# Patient Record
Sex: Female | Born: 1950 | Race: White | Hispanic: No | Marital: Married | Smoking: Never smoker
Health system: Southern US, Community
[De-identification: ages and names within clinical notes are randomized; demographics above are authoritative.]

## PROBLEM LIST (undated history)

## (undated) ENCOUNTER — Emergency Department (HOSPITAL_COMMUNITY): Admission: EM | Payer: 59 | Source: Home / Self Care

## (undated) DIAGNOSIS — L309 Dermatitis, unspecified: Secondary | ICD-10-CM

## (undated) DIAGNOSIS — C4490 Unspecified malignant neoplasm of skin, unspecified: Secondary | ICD-10-CM

## (undated) DIAGNOSIS — E785 Hyperlipidemia, unspecified: Secondary | ICD-10-CM

## (undated) DIAGNOSIS — J45909 Unspecified asthma, uncomplicated: Secondary | ICD-10-CM

## (undated) HISTORY — PX: ANKLE SURGERY: SHX546

## (undated) HISTORY — PX: APPENDECTOMY: SHX54

## (undated) HISTORY — DX: Dermatitis, unspecified: L30.9

## (undated) HISTORY — PX: KNEE SURGERY: SHX244

## (undated) HISTORY — PX: LAPAROTOMY: SHX154

## (undated) HISTORY — DX: Unspecified malignant neoplasm of skin, unspecified: C44.90

## (undated) HISTORY — DX: Hyperlipidemia, unspecified: E78.5

---

## 1999-05-17 ENCOUNTER — Ambulatory Visit (HOSPITAL_COMMUNITY): Admission: RE | Admit: 1999-05-17 | Discharge: 1999-05-17 | Payer: Self-pay | Admitting: Unknown Physician Specialty

## 1999-05-17 ENCOUNTER — Encounter: Payer: Self-pay | Admitting: Unknown Physician Specialty

## 1999-12-18 ENCOUNTER — Encounter: Admission: RE | Admit: 1999-12-18 | Discharge: 2000-03-14 | Payer: Self-pay | Admitting: Orthopedic Surgery

## 2001-05-15 ENCOUNTER — Encounter: Payer: Self-pay | Admitting: Unknown Physician Specialty

## 2001-05-15 ENCOUNTER — Ambulatory Visit (HOSPITAL_COMMUNITY): Admission: RE | Admit: 2001-05-15 | Discharge: 2001-05-15 | Payer: Self-pay | Admitting: Obstetrics and Gynecology

## 2003-09-29 ENCOUNTER — Ambulatory Visit (HOSPITAL_COMMUNITY): Admission: RE | Admit: 2003-09-29 | Discharge: 2003-09-29 | Payer: Self-pay | Admitting: Unknown Physician Specialty

## 2004-04-26 ENCOUNTER — Ambulatory Visit: Payer: Self-pay | Admitting: Internal Medicine

## 2004-06-28 ENCOUNTER — Ambulatory Visit: Payer: Self-pay | Admitting: Family Medicine

## 2004-08-31 ENCOUNTER — Encounter: Admission: RE | Admit: 2004-08-31 | Discharge: 2004-11-29 | Payer: Self-pay | Admitting: Specialist

## 2004-10-18 ENCOUNTER — Encounter: Admission: RE | Admit: 2004-10-18 | Discharge: 2004-10-18 | Payer: Self-pay | Admitting: Specialist

## 2004-10-31 ENCOUNTER — Encounter: Admission: RE | Admit: 2004-10-31 | Discharge: 2004-10-31 | Payer: Self-pay | Admitting: Orthopedic Surgery

## 2004-12-07 ENCOUNTER — Encounter
Admission: RE | Admit: 2004-12-07 | Discharge: 2005-03-07 | Payer: Self-pay | Admitting: Physical Medicine and Rehabilitation

## 2005-01-31 ENCOUNTER — Ambulatory Visit: Payer: Self-pay | Admitting: Internal Medicine

## 2005-02-05 ENCOUNTER — Ambulatory Visit: Payer: Self-pay | Admitting: Internal Medicine

## 2005-07-11 ENCOUNTER — Encounter: Admission: RE | Admit: 2005-07-11 | Discharge: 2005-07-11 | Payer: Self-pay | Admitting: Podiatrist

## 2005-07-16 ENCOUNTER — Encounter: Admission: RE | Admit: 2005-07-16 | Discharge: 2005-09-12 | Payer: Self-pay | Admitting: Family Medicine

## 2005-10-02 ENCOUNTER — Ambulatory Visit (HOSPITAL_COMMUNITY): Admission: RE | Admit: 2005-10-02 | Discharge: 2005-10-02 | Payer: Self-pay | Admitting: Unknown Physician Specialty

## 2005-11-28 ENCOUNTER — Encounter: Admission: RE | Admit: 2005-11-28 | Discharge: 2006-01-15 | Payer: Self-pay

## 2006-10-07 ENCOUNTER — Ambulatory Visit (HOSPITAL_COMMUNITY): Admission: RE | Admit: 2006-10-07 | Discharge: 2006-10-07 | Payer: Self-pay | Admitting: Unknown Physician Specialty

## 2007-09-09 ENCOUNTER — Ambulatory Visit: Payer: Self-pay | Admitting: Internal Medicine

## 2007-09-09 DIAGNOSIS — Z85828 Personal history of other malignant neoplasm of skin: Secondary | ICD-10-CM

## 2007-09-09 DIAGNOSIS — J9801 Acute bronchospasm: Secondary | ICD-10-CM

## 2007-09-09 DIAGNOSIS — J310 Chronic rhinitis: Secondary | ICD-10-CM

## 2007-09-09 DIAGNOSIS — N809 Endometriosis, unspecified: Secondary | ICD-10-CM | POA: Insufficient documentation

## 2007-09-11 ENCOUNTER — Ambulatory Visit: Payer: Self-pay | Admitting: Internal Medicine

## 2007-09-17 LAB — CONVERTED CEMR LAB
AST: 26 units/L (ref 0–37)
Basophils Relative: 1.2 % — ABNORMAL HIGH (ref 0.0–1.0)
CO2: 31 meq/L (ref 19–32)
Calcium: 9.5 mg/dL (ref 8.4–10.5)
Cholesterol: 263 mg/dL (ref 0–200)
Creatinine, Ser: 0.7 mg/dL (ref 0.4–1.2)
Eosinophils Absolute: 0.2 10*3/uL (ref 0.0–0.6)
GFR calc Af Amer: 112 mL/min
Glucose, Bld: 101 mg/dL — ABNORMAL HIGH (ref 70–99)
HDL: 47.9 mg/dL (ref >39.0)
MCHC: 32.6 g/dL (ref 30.0–36.0)
MCV: 87.6 fL (ref 78.0–100.0)
Neutro Abs: 3.7 10*3/uL (ref 1.4–7.7)
Platelets: 306 10*3/uL (ref 150–400)
Potassium: 3.9 meq/L (ref 3.5–5.1)
RDW: 12.5 % (ref 11.5–14.6)
Sodium: 141 meq/L (ref 135–145)

## 2007-09-29 ENCOUNTER — Encounter: Admission: RE | Admit: 2007-09-29 | Discharge: 2007-11-11 | Payer: Self-pay | Admitting: Sports Medicine

## 2007-10-27 ENCOUNTER — Ambulatory Visit (HOSPITAL_COMMUNITY): Admission: RE | Admit: 2007-10-27 | Discharge: 2007-10-27 | Payer: Self-pay | Admitting: Unknown Physician Specialty

## 2009-04-11 ENCOUNTER — Encounter: Admission: RE | Admit: 2009-04-11 | Discharge: 2009-04-11 | Payer: Self-pay | Admitting: Orthopedic Surgery

## 2009-12-13 ENCOUNTER — Ambulatory Visit (HOSPITAL_COMMUNITY): Admission: RE | Admit: 2009-12-13 | Discharge: 2009-12-13 | Payer: Self-pay | Admitting: Unknown Physician Specialty

## 2011-01-16 ENCOUNTER — Encounter: Payer: Self-pay | Admitting: Cardiology

## 2011-01-16 ENCOUNTER — Ambulatory Visit (INDEPENDENT_AMBULATORY_CARE_PROVIDER_SITE_OTHER): Payer: 59 | Admitting: Cardiology

## 2011-01-16 DIAGNOSIS — E785 Hyperlipidemia, unspecified: Secondary | ICD-10-CM

## 2011-01-16 DIAGNOSIS — R079 Chest pain, unspecified: Secondary | ICD-10-CM

## 2011-01-16 NOTE — Patient Instructions (Signed)
We will schedule you for a nuclear stress.  Take a baby ASA daily.  We will decide about cholesterol therapy depending on your cardiac evaluation.  If you have chest pain unrelieved with rest call 911.

## 2011-01-16 NOTE — Progress Notes (Signed)
Allison Webb Date of Birth: 08-09-1950   History of Present Illness: Allison Webb is seen in the request of Dr. Tenny Craw for evaluation of chest pain. She is a pleasant 60 year old white female who has a history of severe hypercholesterolemia. This past Friday she was taking her normal evening walk when she developed mid substernal chest pain described as a tightness. There was no radiation. She did feel somewhat breathless. She noted that the weather was very hot. She rested and her pain went away within 5 minutes. Yesterday she was walking again when she developed similar chest tightness. She had to sit down and the pain was relieved. She then started walking again and her chest pain recurred but was more severe. She described this as pressing real hard in her chest. Today she has had some minor tightness in her chest. She does stay active and usually walks for 30 minutes a night. She also goes to the gym occasionally. She does have severe hypercholesterolemia but is on no medication at this time.  Current Outpatient Prescriptions on File Prior to Visit  Medication Sig Dispense Refill  . finasteride (PROSCAR) 5 MG tablet Take 2.5 mg by mouth as needed.        . naproxen sodium (ANAPROX) 220 MG tablet Take 220 mg by mouth as needed.          Allergies  Allergen Reactions  . Codeine Nausea Only  . Erythromycin     Past Medical History  Diagnosis Date  . Hyperlipidemia   . Skin cancer   . Endometriosis   . Asthma     Past Surgical History  Procedure Date  . Appendectomy   . Laparotomy   . Knee surgery     right knee  . Ankle surgery     right ankle    History  Smoking status  . Never Smoker   Smokeless tobacco  . Not on file    History  Alcohol Use No    Family History  Problem Relation Age of Onset  . Diabetes Father   . Cancer Father     prostate  . Cancer Mother     breast  . Diabetes Mother   . Aneurysm Mother     celiac  . Thyroid disease Sister   . Sleep  apnea Brother   . Thyroid disease Sister   . Celiac disease Sister     Review of Systems: The review of systems is negative for diabetes or hypertension. She is a nonsmoker. All other systems were reviewed and are negative.  Physical Exam: BP 126/88  Pulse 58  Ht 5\' 7"  (1.702 m)  Wt 148 lb 3.2 oz (67.223 kg)  BMI 23.21 kg/m2 She is a pleasant white female in no acute distress. She is normocephalic, atraumatic. Pupils are equal round and reactive to light and accommodation. Extraocular movements are full. Oropharynx is clear. Neck is supple no JVD, adenopathy, thyromegaly, or bruits. Lungs are clear to station and percussion. Cardiac exam reveals a regular rate and rhythm without gallop, murmur, or click. Abdomen is soft and nontender without masses or bruits. Extremities are without edema. Pulses are 2+ and symmetric. Skin is warm and dry. She is alert and oriented x3. Cranial nerves II through XII are intact. LABORATORY DATA: ECG today is normal. Blood work from March of 2012 showed a normal chemistry panel except for an ALT of 68. Total cholesterol 280, trouble stress 227, LDL 189, HDL 48.  Assessment / Plan:

## 2011-01-17 ENCOUNTER — Encounter: Payer: Self-pay | Admitting: Cardiology

## 2011-01-18 ENCOUNTER — Encounter: Payer: Self-pay | Admitting: Cardiology

## 2011-01-18 ENCOUNTER — Encounter: Payer: Self-pay | Admitting: *Deleted

## 2011-01-18 ENCOUNTER — Ambulatory Visit (HOSPITAL_COMMUNITY): Payer: 59 | Attending: Cardiology | Admitting: Radiology

## 2011-01-18 DIAGNOSIS — R079 Chest pain, unspecified: Secondary | ICD-10-CM | POA: Insufficient documentation

## 2011-01-18 DIAGNOSIS — R0789 Other chest pain: Secondary | ICD-10-CM

## 2011-01-18 DIAGNOSIS — E785 Hyperlipidemia, unspecified: Secondary | ICD-10-CM | POA: Insufficient documentation

## 2011-01-18 MED ORDER — TECHNETIUM TC 99M TETROFOSMIN IV KIT
11.0000 | PACK | Freq: Once | INTRAVENOUS | Status: AC | PRN
  Administered 2011-01-18: 11 via INTRAVENOUS

## 2011-01-18 MED ORDER — TECHNETIUM TC 99M TETROFOSMIN IV KIT
33.0000 | PACK | Freq: Once | INTRAVENOUS | Status: AC | PRN
  Administered 2011-01-18: 33 via INTRAVENOUS

## 2011-01-18 NOTE — Progress Notes (Signed)
[facility] SITE 3 NUCLEAR MED [address] [place] [address] [phone]  Cardiology Nuclear Med Study  [name] is a 60 y.o. female [id number] [birth year]   Nuclear Med Background Indication for Stress Test:  Evaluation for Ischemia History:  Hx. of acute bronchospasm; No prior known history of CAD Cardiac Risk Factors: Family History - CAD and Lipids  Symptoms:  Chest Tightness with and without exertion (last date of chest discomfort  at present 1-2/10), DOE, Fatigue and Fatigue with Exertion   Nuclear Pre-Procedure Caffeine/Decaff Intake:  None NPO After: 11:30pm   Lungs:  Clear; O2 Sat 100% RA IV 0.9% NS with Angio Cath:  20g  IV Site: R Antecubital  IV Started by:  Irean Hong, RN  Chest Size (in):  36 Cup Size: D  Height: 5\' 7"  (1.702 m)  Weight:  147 lb (66.679 kg)  BMI:  Body mass index is 23.02 kg/(m^2). Tech Comments:  N/A    Nuclear Med Study 1 or 2 day study: 1 day  Stress Test Type:  Stress  Reading MD: [name], MD  Order Authorizing Provider: Peter Swaziland, MD  Resting Radionuclide: Technetium 23m Tetrofosmin  Resting Radionuclide Dose: 11.0 mCi   Stress Radionuclide:  Technetium 36m Tetrofosmin  Stress Radionuclide Dose: 33.0 mCi           Stress Protocol Rest HR: 72 Stress HR: 139  Rest BP: 104/81 Stress BP: 125/68  Exercise Time (min): 8:15 METS: 10.2   Predicted Max HR: 162 bpm % Max HR: 85.8 bpm Rate Pressure Product: 02725   Dose of Adenosine (mg):  n/a Dose of Lexiscan: n/a mg  Dose of Atropine (mg): n/a Dose of Dobutamine: n/a mcg/kg/min (at max HR)  Stress Test Technologist: Irean Hong, RN  Nuclear Technologist:  Domenic Polite, CNMT     Rest Procedure:  Myocardial perfusion imaging was performed at rest 45 minutes following the intravenous administration of Technetium 5m Tetrofosmin. Rest ECG: NSR with early replorization  Stress Procedure:  The patient exercised for 8 minutes and 15 seconds,  RPE=15.  The patient stopped due to DOE and baseline chest tightness increased to 4-5/10 at peak exercise.  There were nonspecific ST-T wave changes. Technetium 47m Tetrofosmin was injected at peak exercise and myocardial perfusion imaging was performed after a brief delay. Stress ECG: No significant change from baseline ECG  QPS Raw Data Images:  Normal; no motion artifact; normal heart/lung ratio. Stress Images:  No significant abnormalities. Rest Images:  Same as stress Subtraction (SDS):  No evidence of ischemia. Transient Ischemic Dilatation (Normal <1.22):  1.0 Lung/Heart Ratio (Normal <0.45):  0.26  Quantitative Gated Spect Images QGS EDV:  70 ml QGS ESV:  19 ml QGS cine images:  Normal Wall Motion QGS EF: 72%  Impression Exercise Capacity:  Good exercise capacity. BP Response:  Normal blood pressure response. Clinical Symptoms:  Chest tightness ECG Impression:  No significant ST segment change suggestive of ischemia. Comparison with Prior Nuclear Study: No previous nuclear study performed  Overall Impression:  Patient had chest tightness. But there was no EKG change. The nuclear images are normal.   [name]

## 2011-01-18 NOTE — Assessment & Plan Note (Signed)
She has had 2 episodes of exertional chest pain are concerning for angina. Her only cardiac risk factor is a history of significant hyperlipidemia. I recommended that she take an aspirin daily. We will schedule her this week for a stress Myoview study. If abnormal she will need cardiac catheterization. If she does have coronary disease we will need to take an aggressive approach to her hyperlipidemia.

## 2011-01-18 NOTE — Assessment & Plan Note (Signed)
She has significant combined hyperlipidemia. She reports eating a fairly healthy diet. She is opposed to taking medication but we will need to consider this especially if she turns out to have evidence of vascular disease. We will discuss further after her stress test.

## 2011-01-19 NOTE — Progress Notes (Signed)
Nuclear report routed to Dr. Swaziland. Jayanth Szczesniak, Farris Has

## 2011-01-19 NOTE — Progress Notes (Signed)
Fortunately stress nuclear study is normal. EF is normal. I would like to follow up again in one month to see how she is doing. If her symptoms worsen let me know. Let's repeat a lipid panel with her visit. Theron Arista Sw[place]iland

## 2011-01-22 ENCOUNTER — Encounter (HOSPITAL_COMMUNITY): Payer: 59 | Admitting: Radiology

## 2011-01-22 NOTE — Progress Notes (Signed)
Notified of stress test results. Wants to modify diet for about 3 months and exercise before getting lipids checked again. Is feeling better and wants to wait before making another appointment. Wants to get labs done at Dr. Charlott Rakes office. Advised that needs to make sure we get a copy. Will call back if has any further symptoms of chest pain.,

## 2011-01-30 ENCOUNTER — Other Ambulatory Visit (HOSPITAL_COMMUNITY): Payer: Self-pay | Admitting: *Deleted

## 2011-01-30 DIAGNOSIS — Z1231 Encounter for screening mammogram for malignant neoplasm of breast: Secondary | ICD-10-CM

## 2011-02-08 ENCOUNTER — Ambulatory Visit (HOSPITAL_COMMUNITY)
Admission: RE | Admit: 2011-02-08 | Discharge: 2011-02-08 | Disposition: A | Discharge status: Home / Self Care | Payer: 59 | Source: Ambulatory Visit | Attending: Diagnostic Radiology | Admitting: Diagnostic Radiology

## 2011-02-08 DIAGNOSIS — Z1231 Encounter for screening mammogram for malignant neoplasm of breast: Secondary | ICD-10-CM | POA: Insufficient documentation

## 2011-03-01 ENCOUNTER — Ambulatory Visit: Payer: 59 | Admitting: Cardiology

## 2013-01-07 ENCOUNTER — Encounter (HOSPITAL_COMMUNITY): Payer: Self-pay | Admitting: Emergency Medicine

## 2013-01-07 ENCOUNTER — Emergency Department (HOSPITAL_COMMUNITY)
Admission: EM | Admit: 2013-01-07 | Discharge: 2013-01-07 | Disposition: A | Discharge status: Other Acute Inpatient Hospital | Payer: 59 | Source: Home / Self Care | Attending: Family Medicine | Admitting: Family Medicine

## 2013-01-07 ENCOUNTER — Encounter (HOSPITAL_COMMUNITY): Payer: Self-pay

## 2013-01-07 ENCOUNTER — Emergency Department (HOSPITAL_COMMUNITY)
Admission: EM | Admit: 2013-01-07 | Discharge: 2013-01-07 | Disposition: A | Discharge status: Home / Self Care | Payer: 59 | Attending: Emergency Medicine | Admitting: Emergency Medicine

## 2013-01-07 DIAGNOSIS — Z862 Personal history of diseases of the blood and blood-forming organs and certain disorders involving the immune mechanism: Secondary | ICD-10-CM | POA: Insufficient documentation

## 2013-01-07 DIAGNOSIS — T148XXA Other injury of unspecified body region, initial encounter: Secondary | ICD-10-CM

## 2013-01-07 DIAGNOSIS — Z8639 Personal history of other endocrine, nutritional and metabolic disease: Secondary | ICD-10-CM | POA: Insufficient documentation

## 2013-01-07 DIAGNOSIS — R0789 Other chest pain: Secondary | ICD-10-CM

## 2013-01-07 DIAGNOSIS — R071 Chest pain on breathing: Secondary | ICD-10-CM | POA: Insufficient documentation

## 2013-01-07 DIAGNOSIS — Z8709 Personal history of other diseases of the respiratory system: Secondary | ICD-10-CM | POA: Insufficient documentation

## 2013-01-07 DIAGNOSIS — Z8742 Personal history of other diseases of the female genital tract: Secondary | ICD-10-CM | POA: Insufficient documentation

## 2013-01-07 DIAGNOSIS — R079 Chest pain, unspecified: Secondary | ICD-10-CM

## 2013-01-07 DIAGNOSIS — Z85828 Personal history of other malignant neoplasm of skin: Secondary | ICD-10-CM | POA: Insufficient documentation

## 2013-01-07 DIAGNOSIS — Z7982 Long term (current) use of aspirin: Secondary | ICD-10-CM | POA: Insufficient documentation

## 2013-01-07 DIAGNOSIS — Z79899 Other long term (current) drug therapy: Secondary | ICD-10-CM | POA: Insufficient documentation

## 2013-01-07 DIAGNOSIS — J45909 Unspecified asthma, uncomplicated: Secondary | ICD-10-CM | POA: Insufficient documentation

## 2013-01-07 LAB — POCT URINALYSIS DIP (DEVICE)
Bilirubin Urine: NEGATIVE
Glucose, UA: NEGATIVE mg/dL
Ketones, ur: NEGATIVE mg/dL
Leukocytes, UA: NEGATIVE
Urobilinogen, UA: 0.2 mg/dL (ref 0.0–1.0)

## 2013-01-07 LAB — POCT I-STAT, CHEM 8
BUN: 17 mg/dL (ref 6–23)
Calcium, Ion: 1.25 mmol/L (ref 1.13–1.30)
Creatinine, Ser: 0.9 mg/dL (ref 0.50–1.10)
Glucose, Bld: 89 mg/dL (ref 70–99)
HCT: 39 % (ref 36.0–46.0)
Potassium: 4.8 mEq/L (ref 3.5–5.1)
Sodium: 142 mEq/L (ref 135–145)
TCO2: 27 mmol/L (ref 0–100)

## 2013-01-07 LAB — POCT I-STAT TROPONIN I: Troponin i, poc: 0 ng/mL (ref 0.00–0.08)

## 2013-01-07 NOTE — ED Notes (Signed)
Pt c/o left sided rib pain around to back; pt sent from [facility] for blood test; pt denies SOB and sts started this am

## 2013-01-07 NOTE — ED Notes (Addendum)
States that ~10 am today, she experienced pain in mid left chest w extending to back. Denies sweating, SOB, but had some brief nausea. In midst of wedding for nephew . Party last night, another tonight, a lot of out of town guests, has picked her grand child up several times (60 yr old) had not seen her son for past year and he is in town. Wanting to be checked out to be sure she is not having a heart attack. Reportedly had a "clean: stress test 2 years ago History of high cholesterol, but has not been taking the vitamins she was directed to take for past year or so

## 2013-01-07 NOTE — ED Notes (Signed)
Family at bedside. 

## 2013-01-07 NOTE — ED Notes (Signed)
bloodwork collected after two attempts. Pt anxious statesshe needs to leave because she states she has family in town. Pt states, "I just want the enzyme test, not the xray. I have family in town visiting and I can't stay."

## 2013-01-07 NOTE — ED Notes (Signed)
Pt had EKG at [facility], shown to EDP

## 2013-01-07 NOTE — ED Provider Notes (Signed)
History    CSN: [id number] Arrival date & time 01/07/13  1135  First MD Initiated Contact with Patient 01/07/13 1202     Chief Complaint  Patient presents with  . Chest Pain   (Consider location/radiation/quality/duration/timing/severity/associated sxs/prior Treatment) HPI Comments: 62 year old female with history of hyperlipidemia.  Here complaining of left side chest pain with radiation toward her back intermittently since 10 AM today. Pain associated with mild nausea. Denies diaphoresis, shortest of breath vomiting or abdominal pain. Patient states position changes trigger her pain. Denies cough or congestion. No pain with taking deep breath. No fever or chills. Denies headache or dizziness. States that she has been very active with home guests and her grandchildren this week and has been picking them up (one is 60 years old) from the floor repetedly before her pain started. Patient states she had episodes of chest pressure 2 years ago and had a "clear cardiac stress test".  Past Medical History  Diagnosis Date  . Hyperlipidemia   . Skin cancer   . Endometriosis   . Asthma    Past Surgical History  Procedure Laterality Date  . Appendectomy    . Laparotomy    . Knee surgery      right knee  . Ankle surgery      right ankle   Family History  Problem Relation Age of Onset  . Diabetes Father   . Cancer Father     prostate  . Cancer Mother     breast  . Diabetes Mother   . Aneurysm Mother     celiac  . Thyroid disease Sister   . Sleep apnea Brother   . Thyroid disease Sister   . Celiac disease Sister    History  Substance Use Topics  . Smoking status: Never Smoker   . Smokeless tobacco: Not on file  . Alcohol Use: No   OB History   Grav Para Term Preterm Abortions TAB SAB Ect Mult Living                 Review of Systems  Constitutional: Negative for fever, chills, diaphoresis, appetite change and fatigue.  Respiratory: Negative for chest tightness and  shortness of breath.   Cardiovascular: Positive for chest pain. Negative for palpitations and leg swelling.  Gastrointestinal: Positive for nausea. Negative for vomiting, abdominal pain and diarrhea.  Skin: Negative for rash.  All other systems reviewed and are negative.    Allergies  Codeine and Erythromycin  Home Medications   Current Outpatient Rx  Name  Route  Sig  Dispense  Refill  . finasteride (PROSCAR) 5 MG tablet   Oral   Take 2.5 mg by mouth as needed.           . L-TRYPTOPHAN PO   Oral   Take by mouth as needed.           . naproxen sodium (ANAPROX) 220 MG tablet   Oral   Take 220 mg by mouth as needed.            There were no vitals taken for this visit. Physical Exam  Nursing note and vitals reviewed. Constitutional: She is oriented to person, place, and time. She appears well-developed and well-nourished. No distress.  HENT:  Head: Normocephalic and atraumatic.  Eyes: EOM are normal. Pupils are equal, round, and reactive to light.  Neck: Neck supple. No JVD present. No thyromegaly present.  Cardiovascular: Normal rate, regular rhythm, normal heart sounds and intact distal pulses.  Exam reveals no gallop and no friction rub.   No murmur heard. Pulmonary/Chest: Effort normal and breath sounds normal. No respiratory distress. She has no wheezes. She has no rales. She exhibits no tenderness.  Non reproducible pain with compression of left costochondral areas.   Lymphadenopathy:    She has no cervical adenopathy.  Neurological: She is alert and oriented to person, place, and time.  Skin: No rash noted. She is not diaphoretic.    ED Course  Procedures (including critical care time) Labs Reviewed  POCT URINALYSIS DIP (DEVICE) - Abnormal; Notable for the following:    Hgb urine dipstick TRACE (*)    All other components within normal limits   No results found. 1. Chest pain     EKG: Normal sinus rhythm with ventricular rate at 67 beats per minutes.  No acute ischemic changes.  MDM  62 year old female with history of hyperlipidemia  Here complaining of left side chest pain with radiation toward her back intermittently since 10 AM today. Pain associated with mild nausea. Denies diaphoresis, shortest of breath vomiting or abdominal pain. Patient states position changes triggered her pain. On exam: Vital signs are normal and stable. Normal lung, heart and abdominal exam. Decided to transfer to the emergency department for further evaluation and management.    Sharin Grave, MD 01/07/13 [phone]

## 2013-01-07 NOTE — ED Notes (Signed)
Report to nurse first to advise of pending transfer via shuttle. NAD at time of tenasfer

## 2013-01-07 NOTE — ED Provider Notes (Addendum)
History    CSN: [id number] Arrival date & time 01/07/13  1232  First MD Initiated Contact with Patient 01/07/13 1246     Chief Complaint  Patient presents with  . Chest Pain   (Consider location/radiation/quality/duration/timing/severity/associated sxs/prior Treatment) Patient is a 62 y.o. female presenting with chest pain. The history is provided by the patient.  Chest Pain Pain location:  L chest (started in the left thoracic region and radiated into the chest) Pain quality: sharp and stabbing   Pain radiates to:  Does not radiate Associated symptoms: no abdominal pain, no cough, no fatigue, no nausea, no palpitations, no shortness of breath and not vomiting    Past Medical History  Diagnosis Date  . Hyperlipidemia   . Skin cancer   . Endometriosis   . Asthma    Past Surgical History  Procedure Laterality Date  . Appendectomy    . Laparotomy    . Knee surgery      right knee  . Ankle surgery      right ankle   Family History  Problem Relation Age of Onset  . Diabetes Father   . Cancer Father     prostate  . Cancer Mother     breast  . Diabetes Mother   . Aneurysm Mother     celiac  . Thyroid disease Sister   . Sleep apnea Brother   . Thyroid disease Sister   . Celiac disease Sister    History  Substance Use Topics  . Smoking status: Never Smoker   . Smokeless tobacco: Not on file  . Alcohol Use: No   OB History   Grav Para Term Preterm Abortions TAB SAB Ect Mult Living                 Review of Systems  Constitutional: Negative for fatigue.  Respiratory: Negative for cough and shortness of breath.   Cardiovascular: Positive for chest pain. Negative for palpitations and leg swelling.  Gastrointestinal: Negative for nausea, vomiting and abdominal pain.  All other systems reviewed and are negative.    Allergies  Codeine and Erythromycin  Home Medications   Current Outpatient Rx  Name  Route  Sig  Dispense  Refill  . Acetaminophen (TYLENOL  PO)   Oral   Take 1 tablet by mouth once.         Marland Kitchen aspirin EC 325 MG tablet   Oral   Take 325 mg by mouth once.         . Ibuprofen (IBU PO)   Oral   Take 1 tablet by mouth once.         . Naproxen Sodium (ALEVE PO)   Oral   Take 2 capsules by mouth once.          BP 118/71  Pulse 67  Temp(Src) 98.3 F (36.8 C) (Oral)  Resp 18  SpO2 97% Physical Exam  Nursing note and vitals reviewed. Constitutional: She is oriented to person, place, and time. She appears well-developed and well-nourished. No distress.  HENT:  Head: Normocephalic and atraumatic.  Mouth/Throat: Oropharynx is clear and moist.  Eyes: Conjunctivae and EOM are normal. Pupils are equal, round, and reactive to light.  Neck: Normal range of motion. Neck supple.  Cardiovascular: Normal rate, regular rhythm and intact distal pulses.   No murmur heard. Pulmonary/Chest: Effort normal and breath sounds normal. No respiratory distress. She has no wheezes. She has no rales.  Abdominal: Soft. She exhibits no distension. There  is no tenderness. There is no rebound and no guarding.  Musculoskeletal: Normal range of motion. She exhibits no edema and no tenderness.       Arms: Mild tenderness to palpation in the left side of thoracic spine  Neurological: She is alert and oriented to person, place, and time.  Skin: Skin is warm and dry. No rash noted. No erythema.  Psychiatric: She has a normal mood and affect. Her behavior is normal.    ED Course  Procedures (including critical care time) Labs Reviewed  POCT I-STAT, CHEM 8  POCT I-STAT TROPONIN I   No results found. 1. Muscle strain   2. Chest wall pain     MDM   Pt with atypical story for CP that started in the back when she picked up a watermelon and radiated around to her left chest without associated sx.  TIMI  0 and no risk factors.  No sx suggestive of PE.  EKG wnl from urgent care.  CXR, i-stat and troponin pending.  1:34 PM Labs wnl.  Pt  refusing CXR and states will f/u with PCP.  Pt understands the risk of no CXR but will f/u with PCP.  Gwyneth Sprout, MD 01/07/13 1335  Gwyneth Sprout, MD 01/07/13 1336

## 2013-04-08 ENCOUNTER — Other Ambulatory Visit (HOSPITAL_COMMUNITY): Payer: Self-pay | Admitting: Obstetrics and Gynecology

## 2013-04-08 DIAGNOSIS — Z1231 Encounter for screening mammogram for malignant neoplasm of breast: Secondary | ICD-10-CM

## 2013-04-09 ENCOUNTER — Ambulatory Visit (HOSPITAL_COMMUNITY)
Admission: RE | Admit: 2013-04-09 | Discharge: 2013-04-09 | Disposition: A | Discharge status: Home / Self Care | Payer: Managed Care, Other (non HMO) | Source: Ambulatory Visit | Attending: Obstetrics and Gynecology | Admitting: Obstetrics and Gynecology

## 2013-04-09 DIAGNOSIS — Z1231 Encounter for screening mammogram for malignant neoplasm of breast: Secondary | ICD-10-CM | POA: Insufficient documentation

## 2014-05-17 ENCOUNTER — Encounter: Payer: Self-pay | Admitting: Internal Medicine

## 2014-05-17 ENCOUNTER — Encounter (INDEPENDENT_AMBULATORY_CARE_PROVIDER_SITE_OTHER): Payer: Self-pay

## 2014-05-17 ENCOUNTER — Ambulatory Visit (INDEPENDENT_AMBULATORY_CARE_PROVIDER_SITE_OTHER)
Admission: RE | Admit: 2014-05-17 | Discharge: 2014-05-17 | Disposition: A | Discharge status: Home / Self Care | Payer: Managed Care, Other (non HMO) | Source: Ambulatory Visit | Attending: Internal Medicine | Admitting: Internal Medicine

## 2014-05-17 ENCOUNTER — Telehealth: Payer: Self-pay | Admitting: Internal Medicine

## 2014-05-17 ENCOUNTER — Ambulatory Visit (INDEPENDENT_AMBULATORY_CARE_PROVIDER_SITE_OTHER): Payer: Managed Care, Other (non HMO) | Admitting: Internal Medicine

## 2014-05-17 VITALS — BP 120/70 | HR 87 | Temp 98.1°F | Ht 66.5 in | Wt 152.0 lb

## 2014-05-17 DIAGNOSIS — R059 Cough, unspecified: Secondary | ICD-10-CM

## 2014-05-17 DIAGNOSIS — R053 Chronic cough: Secondary | ICD-10-CM | POA: Insufficient documentation

## 2014-05-17 DIAGNOSIS — R05 Cough: Secondary | ICD-10-CM

## 2014-05-17 MED ORDER — PANTOPRAZOLE SODIUM 40 MG PO TBEC: 40.0000 mg | DELAYED_RELEASE_TABLET | Freq: Every day | ORAL | Status: DC

## 2014-05-17 MED ORDER — FAMOTIDINE 20 MG PO TABS: ORAL_TABLET | ORAL | Status: DC

## 2014-05-17 MED ORDER — PREDNISONE 10 MG PO TABS: ORAL_TABLET | ORAL | Status: DC

## 2014-05-17 MED ORDER — TRAMADOL HCL 50 MG PO TABS: 50.0000 mg | ORAL_TABLET | ORAL | Status: DC | PRN

## 2014-05-17 NOTE — Progress Notes (Signed)
Subjective:    Patient ID: Allison Webb, female    DOB: [birth year],   MRN: [id number]  HPI  89 yowf never smoker with cough since mid 2014 referred by Dr Melinda Crutch for refractory cough to pulmonary clinic 05/18/2014    05/17/2014 1st [place] [facility] office visit/ [facility]   Chief Complaint  Patient presents with  . Pulmonary Consult    Self referral. Pt c/o cough for the past 1.5 yrs. Cough is esp worse at night and is non prod. It can be triggered by taking a deep breath.     Coughing daily, dry , never with exertion but frequently with deep  Breath  ? Acute onset with uri but persistent pattern developed after URI resolved  not resp to date gerd rx in low doses or otc's or nasal steroids.    Kouffman Reflux v Neurogenic Cough Differentiator Reflux Comments  Do you awaken from a sound sleep coughing violently?                            With trouble breathing? no   Do you have choking episodes when you cannot  Get enough air, gasping for air ?              no   Do you usually cough when you lie down into  The bed, or when you just lie down to rest ?                          Yes   Do you usually cough after meals or eating?         Maybe, nuts   Do you cough when (or after) you bend over?    no   GERD SCORE     Kouffman Reflux v Neurogenic Cough Differentiator Neurogenic   Do you more-or-less cough all day long? No spurts   Does change of temperature make you cough? no   Does laughing or chuckling cause you to cough? no   Do fumes (perfume, automobile fumes, burned  Toast, etc.,) cause you to cough ?      no   Does speaking, singing, or talking on the phone cause you to cough   ?               Yes    Neurogenic/Airway score        ent eval one year prior to OV  > neg  No obvious other patterns in day to day or daytime variabilty or assoc sob or cp or chest tightness, subjective wheeze overt sinus or hb symptoms. No unusual exp hx or h/o childhood pna/ asthma or knowledge  of premature birth.  Sleeping ok without nocturnal  or early am exacerbation  of respiratory  c/o's or need for noct saba. Also denies any obvious fluctuation of symptoms with weather or environmental changes or other aggravating or alleviating factors except as outlined above   Current Medications, Allergies, Complete Past Medical History, Past Surgical History, Family History, and Social History were reviewed in [employer] record.          Review of Systems  Constitutional: Negative for fever, chills and unexpected weight change.  HENT: Positive for sore throat. Negative for congestion, dental problem, ear pain, nosebleeds, postnasal drip, rhinorrhea, sinus pressure, sneezing, trouble swallowing and voice change.   Eyes: Negative for visual disturbance.  Respiratory: Positive  for cough. Negative for choking and shortness of breath.   Cardiovascular: Negative for chest pain and leg swelling.  Gastrointestinal: Negative for vomiting, abdominal pain and diarrhea.  Genitourinary: Negative for difficulty urinating.       Acid heartburn  Musculoskeletal: Negative for arthralgias.  Skin: Negative for rash.  Neurological: Negative for tremors, syncope and headaches.  Hematological: Does not bruise/bleed easily.       Objective:   Physical Exam  amb pleasant wf nad  HEENT: nl dentition, turbinates, and orophanx. Nl external ear canals without cough reflex   NECK :  without JVD/Nodes/TM/ nl carotid upstrokes bilaterally   LUNGS: no acc muscle use, clear to A and P bilaterally without cough on insp or exp maneuvers   CV:  RRR  no s3 or murmur or increase in P2, no edema   ABD:  soft and nontender with nl excursion in the supine position. No bruits or organomegaly, bowel sounds nl  MS:  warm without deformities, calf tenderness, cyanosis or clubbing  SKIN: warm and dry without lesions    NEURO:  alert, approp, no deficits   CXR  05/17/2014 :    No  active cardiopulmonary disease        Assessment & Plan:

## 2014-05-17 NOTE — Patient Instructions (Signed)
The key to effective treatment for your cough is eliminating the non-stop cycle of cough you're stuck in long enough to let your airway heal completely and then see if there is anything still making you cough once you stop the cough suppression, but this should take no more than 5 days to figure out  First take delsym two tsp every 12 hours and supplement if needed with  tramadol 50 mg up to 2 every 4 hours to suppress the urge to cough at all or even clear your throat. Swallowing water or using ice chips/non mint and menthol containing candies (such as lifesavers or sugarless jolly ranchers) are also effective.  You should rest your voice and avoid activities that you know make you cough.  Once you have eliminated the cough for 3 straight days try reducing the tramadol first,  then the delsym as tolerated.    Prednisone 10 mg take  4 each am x 2 days,   2 each am x 2 days,  1 each am x 2 days and stop (this is to eliminate allergies and inflammation from coughing)  Protonix (pantoprazole) Take 30-60 min before first meal of the day and Pepcid 20 mg one bedtime plus chlorpheniramine 4 mg x 2 at bedtime (both available over the counter)  until cough is completely gone for at least a week without the need for cough suppression  GERD (REFLUX)  is an extremely common cause of respiratory symptoms, many times with no significant heartburn at all.    It can be treated with medication, but also with lifestyle changes including avoidance of late meals, excessive alcohol, smoking cessation, and avoid fatty foods, chocolate, peppermint, colas, red wine, and acidic juices such as orange juice.  NO MINT OR MENTHOL PRODUCTS SO NO COUGH DROPS  USE HARD CANDY INSTEAD (jolley ranchers or Stover's or Lifesavers (all available in sugarless versions) NO OIL BASED VITAMINS - use powdered substitutes.   Please remember to go to the  x-ray department downstairs for your tests - we will call you with the results when  they are available.     Please schedule a follow up office visit in 2 weeks, sooner if needed

## 2014-05-17 NOTE — Telephone Encounter (Signed)
Pt states that she has an intolerance to Tramadol and is concerned to take this.  Would like other rec's for another medication. [facility]  Dr Melvyn Novas please advise. Thanks.

## 2014-05-18 ENCOUNTER — Telehealth: Payer: Self-pay | Admitting: Internal Medicine

## 2014-05-18 NOTE — Telephone Encounter (Signed)
Spoke with the pt a She states that she remembered after her visit 05/17/14 that she can not take tramadol  "It makes me deathly ill" I have added this to list of allergies Pt asking for something else for the cough  Please advise, thanks!

## 2014-05-18 NOTE — Telephone Encounter (Signed)
Try vicodin 5-325 #40 take 1-2 every 4 hours prn cough

## 2014-05-18 NOTE — Progress Notes (Signed)
Quick Note:  lmtcb ______ 

## 2014-05-18 NOTE — Progress Notes (Signed)
Quick Note:  Spoke with pt and notified of results per Dr. Wert. Pt verbalized understanding and denied any questions.  ______ 

## 2014-05-18 NOTE — Assessment & Plan Note (Addendum)
The most common causes of chronic cough in immunocompetent adults include the following: upper airway cough syndrome (UACS), previously referred to as postnasal drip syndrome (PNDS), which is caused by variety of rhinosinus conditions; (2) asthma; (3) GERD; (4) chronic bronchitis from cigarette smoking or other inhaled environmental irritants; (5) nonasthmatic eosinophilic bronchitis; and (6) bronchiectasis.   These conditions, singly or in combination, have accounted for up to 94% of the causes of chronic cough in prospective studies.   Other conditions have constituted no >6% of the causes in prospective studies These have included bronchogenic carcinoma, chronic interstitial pneumonia, sarcoidosis, left ventricular failure, ACEI-induced cough, and aspiration from a condition associated with pharyngeal dysfunction.    Chronic cough is often simultaneously caused by more than one condition. A single cause has been found from 38 to 82% of the time, multiple causes from 18 to 62%. Multiply caused cough has been the result of three diseases up to 42% of the time.       Based on hx and exam, this is most likely:  Classic Upper airway cough syndrome, so named because it's frequently impossible to sort out how much is  CR/sinusitis with freq throat clearing (which can be related to primary GERD)   vs  causing  secondary (" extra esophageal")  GERD from wide swings in gastric pressure that occur with throat clearing, often  promoting self use of mint and menthol lozenges that reduce the lower esophageal sphincter tone and exacerbate the problem further in a cyclical fashion.   These are the same pts (now being labeled as having "irritable larynx syndrome" by some cough centers) who not infrequently have a history of having failed to tolerate ace inhibitors,  dry powder inhalers or biphosphonates or report having atypical reflux symptoms that don't respond to standard doses of PPI , and are easily confused as  having aecopd or asthma flares by even experienced allergists/ pulmonologists.   The first step is to maximize acid suppression and eliminate cyclical coughing then regroup in 2 weeks  See instructions for specific recommendations which were reviewed directly with the patient who was given a copy with highlighter outlining the key components.

## 2014-05-19 MED ORDER — HYDROCODONE-ACETAMINOPHEN 5-325 MG PO TABS: 1.0000 | ORAL_TABLET | ORAL | Status: DC | PRN

## 2014-05-19 NOTE — Telephone Encounter (Signed)
Spoke with patient-aware of new RX and she must pick up RX-pt will come by after 12 pm today to pick up. Rx printed for MW to sign and place up front. Nothing more needed at this time.

## 2014-05-20 ENCOUNTER — Telehealth: Payer: Self-pay | Admitting: Internal Medicine

## 2014-05-20 NOTE — Telephone Encounter (Signed)
Called and spoke to pt. Pt stated she is going out of town and will not be back until 12/1 and wanted to know if MW gives out a second list of instructions after the recs he gave at the first visit. Advised pt that the f/u visit is to reassess how she is doing and does not give out a standard set of instructions. Informed pt to keep appt on 12/1. Pt verbalized understanding and denied any further questions or concerns at this time.

## 2014-06-08 ENCOUNTER — Encounter: Payer: Self-pay | Admitting: Internal Medicine

## 2014-06-08 ENCOUNTER — Other Ambulatory Visit (INDEPENDENT_AMBULATORY_CARE_PROVIDER_SITE_OTHER): Payer: Managed Care, Other (non HMO)

## 2014-06-08 ENCOUNTER — Ambulatory Visit (INDEPENDENT_AMBULATORY_CARE_PROVIDER_SITE_OTHER): Payer: Managed Care, Other (non HMO) | Admitting: Internal Medicine

## 2014-06-08 VITALS — BP 112/66 | HR 94 | Ht 66.5 in | Wt 154.0 lb

## 2014-06-08 DIAGNOSIS — R059 Cough, unspecified: Secondary | ICD-10-CM

## 2014-06-08 DIAGNOSIS — R05 Cough: Secondary | ICD-10-CM

## 2014-06-08 LAB — CBC WITH DIFFERENTIAL/PLATELET
BASOS ABS: 0 10*3/uL (ref 0.0–0.1)
BASOS PCT: 0.4 % (ref 0.0–3.0)
Eosinophils Absolute: 0.1 10*3/uL (ref 0.0–0.7)
Eosinophils Relative: 1.9 % (ref 0.0–5.0)
HEMATOCRIT: 39.5 % (ref 36.0–46.0)
HEMOGLOBIN: 12.9 g/dL (ref 12.0–15.0)
LYMPHS ABS: 1.3 10*3/uL (ref 0.7–4.0)
Lymphocytes Relative: 19.5 % (ref 12.0–46.0)
MCHC: 32.6 g/dL (ref 30.0–36.0)
MCV: 85.9 fl (ref 78.0–100.0)
MONOS PCT: 6.5 % (ref 3.0–12.0)
Monocytes Absolute: 0.4 10*3/uL (ref 0.1–1.0)
NEUTROS ABS: 4.9 10*3/uL (ref 1.4–7.7)
Neutrophils Relative %: 71.7 % (ref 43.0–77.0)
Platelets: 313 10*3/uL (ref 150.0–400.0)
RBC: 4.59 Mil/uL (ref 3.87–5.11)
RDW: 13.5 % (ref 11.5–15.5)
WBC: 6.9 10*3/uL (ref 4.0–10.5)

## 2014-06-08 MED ORDER — PANTOPRAZOLE SODIUM 40 MG PO TBEC: 40.0000 mg | DELAYED_RELEASE_TABLET | Freq: Every day | ORAL | Status: DC

## 2014-06-08 MED ORDER — FAMOTIDINE 20 MG PO TABS: ORAL_TABLET | ORAL | Status: DC

## 2014-06-08 NOTE — Progress Notes (Signed)
Subjective:    Patient ID: Allison Webb, female    DOB: 01/25/1951,   MRN: [id number]   Brief patient profile:  50 yowf never smoker with cough since mid 2014 referred by Dr Melinda Crutch for refractory cough to pulmonary clinic 05/18/2014   History of Present Illness  05/17/2014 [facility] office visit/ Cardin Nitschke   Chief Complaint  Patient presents with  . Pulmonary Consult    Self referral. Pt c/o cough for the past 1.5 yrs. Cough is esp worse at night and is non prod. It can be triggered by taking a deep breath.    Coughing daily, dry , never with exertion but frequently with deep  Breath  ? Acute onset with uri but persistent pattern developed after URI resolved not resp to date gerd rx in low doses or otc's or nasal steroids.  ent eval one year prior to OV  > neg Allergy eval in [place] y prior to [place] grass/dust/trees Took saba for sudden loss of voice/ sob > not convinced helped  rec Cyclical cough protocol    06/08/2014 f/u ov/Blima Jaimes re: chronic daily  Since mid 2014  Chief Complaint  Patient presents with  . Follow-up    Pt states that her cough is unchanged since the last visit. No new co's today.      Kouffman Reflux v Neurogenic Cough Differentiator Reflux Comments  Do you awaken from a sound sleep coughing violently?                            With trouble breathing? no   Do you have choking episodes when you cannot  Get enough air, gasping for air ?              no   Do you usually cough when you lie down into  The bed, or when you just lie down to rest ?                          Not now    Do you usually cough after meals or eating?         Yes - milk   Do you cough when (or after) you bend over?    no   GERD SCORE     Kouffman Reflux v Neurogenic Cough Differentiator Neurogenic   Do you more-or-less cough all day long? Feels urge   Does change of temperature make you cough? no   Does laughing or chuckling cause you to cough? no   Do fumes  (perfume, automobile fumes, burned  Toast, etc.,) cause you to cough ?      no   Does speaking, singing, or talking on the phone cause you to cough   ?               sometimes   Neurogenic/Airway score       No obvious day to day or daytime variabilty or assoc sob  cp or chest tightness, subjective wheeze overt sinus or hb symptoms. No unusual exp hx or h/o childhood pna/ asthma or knowledge of premature birth.  Sleeping ok without nocturnal  or early am exacerbation  of respiratory  c/o's or need for noct saba. Also denies any obvious fluctuation of symptoms with weather or environmental changes or other aggravating or alleviating factors except as outlined above   Current Medications, Allergies, Complete Past  Medical History, Past Surgical History, Family History, and Social History were reviewed in [employer] record.  ROS  The following are not active complaints unless bolded sore throat, dysphagia, dental problems, itching, sneezing,  nasal congestion or excess/ purulent secretions, ear ache,   fever, chills, sweats, unintended wt loss, pleuritic or exertional cp, hemoptysis,  orthopnea pnd or leg swelling, presyncope, palpitations, heartburn, abdominal pain, anorexia, nausea, vomiting, diarrhea  or change in bowel or urinary habits, change in stools or urine, dysuria,hematuria,  rash, arthralgias, visual complaints, headache, numbness weakness or ataxia or problems with walking or coordination,  change in mood/affect or memory.                          Objective:   Physical Exam  amb pleasant wf nad  Wt Readings from Last 3 Encounters:  06/08/14 154 lb (69.854 kg)  05/17/14 152 lb (68.947 kg)  01/18/11 147 lb (66.679 kg)    Vital signs reviewed   HEENT: nl dentition, turbinates, and orophanx. Nl external ear canals without cough reflex   NECK :  without JVD/Nodes/TM/ nl carotid upstrokes bilaterally   LUNGS: no acc muscle use, clear to A and P  bilaterally without cough on insp or exp maneuvers   CV:  RRR  no s3 or murmur or increase in P2, no edema   ABD:  soft and nontender with nl excursion in the supine position. No bruits or organomegaly, bowel sounds nl  MS:  warm without deformities, calf tenderness, cyanosis or clubbing  SKIN: warm and dry without lesions    NEURO:  alert, approp, no deficits   CXR  05/17/2014 :    No active cardiopulmonary disease        Assessment & Plan:

## 2014-06-08 NOTE — Patient Instructions (Addendum)
Please remember to go to the lab  department downstairs for your tests - we will call you with the results when they are available.  Protonix (pantoprazole) Take 30-60 min before first meal of the day and Pepcid (famotidine) 20 mg one bedtime plus chlorpheniramine 4 mg x 2 at bedtime (both available over the counter)  until cough is completely gone for at least a week without the need for cough suppression.  Please see patient coordinator before you leave today  to schedule methacholine challenge test and if neg I will recommend you see Dr Joya Gaskins at [facility] / voice center

## 2014-06-09 LAB — ALLERGY FULL PROFILE
Allergen, D pternoyssinus,d7: <0.1 kU/L
Allergen,Goose feathers, e70: <0.1 kU/L
Alternaria Alternata: <0.1 kU/L
Aspergillus fumigatus, m3: <0.1 kU/L
Bahia Grass: <0.1 kU/L
Bermuda Grass: <0.1 kU/L
Box Elder IgE: <0.1 kU/L
Candida Albicans: <0.1 kU/L
Cat Dander: <0.1 kU/L
Common Ragweed: <0.1 kU/L
Curvularia lunata: <0.1 kU/L
D. farinae: <0.1 kU/L
Dog Dander: <0.1 kU/L
Elm IgE: <0.1 kU/L
Fescue: <0.1 kU/L
G005 Rye, Perennial: <0.1 kU/L
G009 Red Top: <0.1 kU/L
Goldenrod: <0.1 kU/L
Helminthosporium halodes: <0.1 kU/L
House Dust Hollister: <0.1 kU/L
IgE (Immunoglobulin E), Serum: 44 kU/L
Lamb's Quarters: <0.1 kU/L
Oak: <0.1 kU/L
Plantain: <0.1 kU/L
Stemphylium Botryosum: <0.1 kU/L
Sycamore Tree: <0.1 kU/L
Timothy Grass: <0.1 kU/L

## 2014-06-09 NOTE — Assessment & Plan Note (Addendum)
desptie pts concern "cough is the same" clearly per the questionaire it's quite a bit better but still strongly pointing to upper airway source and would be reasonable to refer to ent / [employer] but first need to do MCT on gerd rx to exclude any asthmatic component and complete basic allergy profile  In meantime needs to continue max rx for gerd and also h1 at hs as regardless of the mech for the cough it is likely she has gerd either as a primary or secondary event   Each maintenance medication was reviewed in detail including most importantly the difference between maintenance and as needed and under what circumstances the prns are to be used.  Please see instructions for details which were reviewed in writing and the patient given a copy.

## 2014-06-10 NOTE — Progress Notes (Signed)
Quick Note:  Spoke with pt and notified of results per Dr. Wert. Pt verbalized understanding and denied any questions.  ______ 

## 2014-06-23 ENCOUNTER — Encounter (HOSPITAL_COMMUNITY): Payer: Managed Care, Other (non HMO)

## 2014-07-27 ENCOUNTER — Ambulatory Visit (HOSPITAL_COMMUNITY)
Admission: RE | Admit: 2014-07-27 | Discharge: 2014-07-27 | Disposition: A | Discharge status: Home / Self Care | Payer: Managed Care, Other (non HMO) | Source: Ambulatory Visit | Attending: Internal Medicine | Admitting: Internal Medicine

## 2014-07-27 DIAGNOSIS — R059 Cough, unspecified: Secondary | ICD-10-CM

## 2014-07-27 DIAGNOSIS — R05 Cough: Secondary | ICD-10-CM | POA: Diagnosis not present

## 2014-07-27 LAB — PULMONARY FUNCTION TEST
FEF 25-75 POST: 3.76 L/s
FEF 25-75 PRE: 1.42 L/s
FEF2575-%CHANGE-POST: 165 %
FEF2575-%PRED-POST: 155 %
FEF2575-%Pred-Pre: 58 %
FEV1-%CHANGE-POST: 47 %
FEV1-%PRED-POST: 101 %
FEV1-%Pred-Pre: 69 %
FEV1-PRE: 1.9 L
FEV1-Post: 2.8 L
FEV1FVC-%CHANGE-POST: 38 %
FEV1FVC-%PRED-PRE: 78 %
FEV6-%CHANGE-POST: 7 %
FEV6-%PRED-POST: 95 %
FEV6-%PRED-PRE: 89 %
FEV6-Post: 3.29 L
FEV6-Pre: 3.08 L
FEV6FVC-%CHANGE-POST: 0 %
FEV6FVC-%PRED-POST: 104 %
FEV6FVC-%PRED-PRE: 103 %
FVC-%Change-Post: 6 %
FVC-%PRED-POST: 92 %
FVC-%PRED-PRE: 86 %
FVC-PRE: 3.1 L
FVC-Post: 3.3 L
POST FEV6/FVC RATIO: 100 %
Post FEV1/FVC ratio: 85 %
Pre FEV1/FVC ratio: 61 %
Pre FEV6/FVC Ratio: 99 %

## 2014-07-27 MED ORDER — METHACHOLINE 4 MG/ML NEB SOLN
2.0000 mL | Freq: Once | RESPIRATORY_TRACT | Status: AC
Start: 2014-07-27 — End: 2014-07-27
  Administered 2014-07-27: 8 mg via RESPIRATORY_TRACT

## 2014-07-27 MED ORDER — METHACHOLINE 0.0625 MG/ML NEB SOLN
2.0000 mL | Freq: Once | RESPIRATORY_TRACT | Status: AC
  Administered 2014-07-27: 0.125 mg via RESPIRATORY_TRACT

## 2014-07-27 MED ORDER — SODIUM CHLORIDE 0.9 % IN NEBU
3.0000 mL | INHALATION_SOLUTION | Freq: Once | RESPIRATORY_TRACT | Status: AC
  Administered 2014-07-27: 3 mL via RESPIRATORY_TRACT

## 2014-07-27 MED ORDER — ALBUTEROL SULFATE (2.5 MG/3ML) 0.083% IN NEBU
2.5000 mg | INHALATION_SOLUTION | Freq: Once | RESPIRATORY_TRACT | Status: AC
  Administered 2014-07-27: 2.5 mg via RESPIRATORY_TRACT

## 2014-07-27 MED ORDER — METHACHOLINE 1 MG/ML NEB SOLN
2.0000 mL | Freq: Once | RESPIRATORY_TRACT | Status: AC
  Administered 2014-07-27: 2 mg via RESPIRATORY_TRACT

## 2014-07-27 MED ORDER — METHACHOLINE 16 MG/ML NEB SOLN
2.0000 mL | Freq: Once | RESPIRATORY_TRACT | Status: AC
  Administered 2014-07-27: 32 mg via RESPIRATORY_TRACT

## 2014-07-27 MED ORDER — METHACHOLINE 0.25 MG/ML NEB SOLN
2.0000 mL | Freq: Once | RESPIRATORY_TRACT | Status: AC
  Administered 2014-07-27: 0.5 mg via RESPIRATORY_TRACT

## 2014-08-04 NOTE — Progress Notes (Signed)
Quick Note:  Patient returned call. Advised of PFT results / recs as stated by MW. Pt verbalized understanding - she does report that her cough is unchanged since last visit but she is 'willing' herself not to cough. Last ov note mentions sending her to [place] stated that her daughter saw someone that worked well with her and will call the office back next week with that provider's name. MW, once pt returns call would like to refer her out? Thanks. ______

## 2014-08-17 ENCOUNTER — Telehealth: Payer: Self-pay | Admitting: Internal Medicine

## 2014-08-17 DIAGNOSIS — R059 Cough, unspecified: Secondary | ICD-10-CM

## 2014-08-17 DIAGNOSIS — R05 Cough: Secondary | ICD-10-CM

## 2014-08-19 NOTE — Telephone Encounter (Signed)
Fine with me

## 2014-08-19 NOTE — Progress Notes (Signed)
Quick Note:  Called and spoke to pt. Please see phone note from 08/17/2014. Will sign off. ______

## 2014-08-19 NOTE — Telephone Encounter (Signed)
Called spoke with pt. She wants a referral to Dr. Wonda Amis at [facility] instead of Dr. Joya Gaskins. Please advise thanks

## 2014-08-19 NOTE — Telephone Encounter (Signed)
Called & spoke with pt; advised I am faxing her records to Dr. Wonda Amis.  Pt states her appt is 08/26/14 @2 :15 pm Allison Webb

## 2014-08-19 NOTE — Telephone Encounter (Signed)
Called and spoke to pt. Informed pt MW gave the ok for the referral. Order placed. Pt verbalized understanding and denied any further questions or concerns at this time.   Pt stated the records need to be faxed to [phone]. PCC's please advise.

## 2015-02-09 ENCOUNTER — Other Ambulatory Visit: Payer: Self-pay | Admitting: Internal Medicine

## 2016-08-28 ENCOUNTER — Other Ambulatory Visit: Payer: Self-pay | Admitting: Obstetrics & Gynecology

## 2016-08-28 DIAGNOSIS — Z1231 Encounter for screening mammogram for malignant neoplasm of breast: Secondary | ICD-10-CM

## 2016-09-11 ENCOUNTER — Ambulatory Visit
Admission: RE | Admit: 2016-09-11 | Discharge: 2016-09-11 | Disposition: A | Discharge status: Home / Self Care | Payer: Managed Care, Other (non HMO) | Source: Ambulatory Visit | Attending: Obstetrics & Gynecology | Admitting: Obstetrics & Gynecology

## 2016-09-11 DIAGNOSIS — Z1231 Encounter for screening mammogram for malignant neoplasm of breast: Secondary | ICD-10-CM

## 2016-09-13 ENCOUNTER — Other Ambulatory Visit: Payer: Self-pay | Admitting: Obstetrics & Gynecology

## 2016-09-13 DIAGNOSIS — R928 Other abnormal and inconclusive findings on diagnostic imaging of breast: Secondary | ICD-10-CM

## 2016-09-14 ENCOUNTER — Ambulatory Visit
Admission: RE | Admit: 2016-09-14 | Discharge: 2016-09-14 | Disposition: A | Discharge status: Home / Self Care | Payer: Managed Care, Other (non HMO) | Source: Ambulatory Visit | Attending: Obstetrics & Gynecology | Admitting: Obstetrics & Gynecology

## 2016-09-14 DIAGNOSIS — R928 Other abnormal and inconclusive findings on diagnostic imaging of breast: Secondary | ICD-10-CM

## 2016-09-17 ENCOUNTER — Other Ambulatory Visit: Payer: Managed Care, Other (non HMO)

## 2016-09-18 ENCOUNTER — Other Ambulatory Visit: Payer: Managed Care, Other (non HMO)

## 2017-03-28 ENCOUNTER — Other Ambulatory Visit: Payer: Self-pay | Admitting: Family Medicine

## 2017-03-28 DIAGNOSIS — M542 Cervicalgia: Secondary | ICD-10-CM

## 2017-04-01 ENCOUNTER — Ambulatory Visit
Admission: RE | Admit: 2017-04-01 | Discharge: 2017-04-01 | Disposition: A | Discharge status: Home / Self Care | Payer: Managed Care, Other (non HMO) | Source: Ambulatory Visit | Attending: Family Medicine | Admitting: Family Medicine

## 2017-04-01 DIAGNOSIS — M542 Cervicalgia: Secondary | ICD-10-CM

## 2017-04-24 ENCOUNTER — Ambulatory Visit (INDEPENDENT_AMBULATORY_CARE_PROVIDER_SITE_OTHER): Payer: Managed Care, Other (non HMO) | Admitting: Allergy and Immunology

## 2017-04-24 ENCOUNTER — Encounter: Payer: Self-pay | Admitting: Allergy and Immunology

## 2017-04-24 VITALS — BP 106/68 | HR 73 | Temp 98.0°F | Resp 16 | Ht 67.0 in | Wt 152.0 lb

## 2017-04-24 DIAGNOSIS — Z91018 Allergy to other foods: Secondary | ICD-10-CM | POA: Diagnosis not present

## 2017-04-24 DIAGNOSIS — T7840XA Allergy, unspecified, initial encounter: Secondary | ICD-10-CM | POA: Insufficient documentation

## 2017-04-24 DIAGNOSIS — R05 Cough: Secondary | ICD-10-CM | POA: Diagnosis not present

## 2017-04-24 DIAGNOSIS — J310 Chronic rhinitis: Secondary | ICD-10-CM

## 2017-04-24 DIAGNOSIS — T7840XD Allergy, unspecified, subsequent encounter: Secondary | ICD-10-CM

## 2017-04-24 DIAGNOSIS — R053 Chronic cough: Secondary | ICD-10-CM

## 2017-04-24 MED ORDER — MOMETASONE FUROATE 50 MCG/ACT NA SUSP: NASAL | 5 refills | Status: DC

## 2017-04-24 NOTE — Patient Instructions (Addendum)
History of food allergy The patient's history suggests food allergy, however food allergen skin tests were negative today.  The histamine control was borderline reactive, therefore the testing may have been affected by the recent course of prednisone.  To be thorough, we will assess serum specific IgE against food allergens.  Laboratory form has been provided for serum tryptase level, and serum specific IgE against tree nut panel, peanut, general food panel, and alpha-gal panel.  Until food allergy has been definitively ruled out, continue avoidance of tree nuts and any other foods that cause untoward symptoms.  Chronic rhinitis  A prescription has been provided for Nasonex nasal spray, one spray per nostril 1-2 times daily as needed. Proper nasal spray technique has been discussed and demonstrated.  Nasal saline lavage (NeilMed) has been recommended as needed and prior to medicated nasal sprays along with instructions for proper administration.  For thick post nasal drainage, add guaifenesin [phone] mg (Mucinex)  twice daily as needed with adequate hydration as discussed.  Laboratory order form has been provided for serum specific IgE against environmental panel.  Cough, persistent The most common causes of chronic cough include the following: upper airway cough syndrome (UACS) which is caused by variety of rhinosinus conditions; asthma; gastroesophageal reflux disease (GERD); chronic bronchitis from cigarette smoking or other inhaled environmental irritants; non-asthmatic eosinophilic bronchitis; and bronchiectasis. In prospective studies, these conditions have accounted for up to 94% of the causes of chronic cough in immunocompetent adults. The history and physical examination suggest that her cough is multifactorial with contribution from postnasal drainage. We will address these issues at this time.   Treatment plan as outlined above for chronic rhinitis.   When lab results have returned  the patient will be called with further recommendations and follow up instructions.

## 2017-04-24 NOTE — Assessment & Plan Note (Addendum)
The patient's history suggests food allergy, however food allergen skin tests were negative today.  The histamine control was borderline reactive, therefore the testing may have been affected by the recent course of prednisone.  To be thorough, we will assess serum specific IgE against food allergens.  Laboratory form has been provided for serum tryptase level, and serum specific IgE against tree nut panel, peanut, general food panel, and alpha-gal panel.  Until food allergy has been definitively ruled out, continue avoidance of tree nuts and any other foods that cause untoward symptoms.

## 2017-04-24 NOTE — Progress Notes (Addendum)
New Patient Note  RE: Allison Webb MRN: [id number] DOB: Dec 14, 1950 Date of Office Visit: 04/24/2017  Referring provider: Lawerance Cruel, MD Primary care provider: Lawerance Cruel, MD  Chief Complaint: Allergic Reaction; Cough; and Allergic Rhinitis    History of present illness: Allison Webb is a 66 y.o. female seen today in consultation requested by Lawerance Cruel, MD.  She reports that approximately 2 months ago she consumed a chocolate bar containing almonds and developed the sensation of throat tightness/swelling.  She did not experience concomitant urticaria, cardiopulmonary symptoms, or other GI symptoms.  She recalled that on multiple occasions, after consuming almonds or mixed nuts, that she had experience a similar sensation of swelling in the throat.  She finished a course of prednisone a few days ago for this problem. [name] experiences nasal congestion, thick postnasal drainage, irritated throat, rhinorrhea, nasal pruritus, ocular pruritus, and coughing.  The symptoms tend to be more frequent and severe with pollen exposure.  She used to take loratadine but on 1 or 2 occasions while taking this medication she felt like she "could not breathe." She has avoided second generation antihistamines since that time. Approximately 25 years ago she was on immunotherapy for about 1 year. She complains of a persistent cough which is nonproductive and seems to originate as a tickle at the base of her throat.  Asthma and laryngopharyngeal reflux have been ruled out by pulmonology and gastroenterology, respectively.   Assessment and plan: History of food allergy The patient's history suggests food allergy, however food allergen skin tests were negative today.  The histamine control was borderline reactive, therefore the testing may have been affected by the recent course of prednisone.  To be thorough, we will assess serum specific IgE against food allergens.  Laboratory form has  been provided for serum tryptase level, and serum specific IgE against tree nut panel, peanut, general food panel, and alpha-gal panel.  Until food allergy has been definitively ruled out, continue avoidance of tree nuts and any other foods that cause untoward symptoms.  Chronic rhinitis  A prescription has been provided for Nasonex nasal spray, one spray per nostril 1-2 times daily as needed. Proper nasal spray technique has been discussed and demonstrated.  Nasal saline lavage (NeilMed) has been recommended as needed and prior to medicated nasal sprays along with instructions for proper administration.  For thick post nasal drainage, add guaifenesin [phone] mg (Mucinex)  twice daily as needed with adequate hydration as discussed.  Laboratory order form has been provided for serum specific IgE against environmental panel.  Cough, persistent The most common causes of chronic cough include the following: upper airway cough syndrome (UACS) which is caused by variety of rhinosinus conditions; asthma; gastroesophageal reflux disease (GERD); chronic bronchitis from cigarette smoking or other inhaled environmental irritants; non-asthmatic eosinophilic bronchitis; and bronchiectasis. In prospective studies, these conditions have accounted for up to 94% of the causes of chronic cough in immunocompetent adults. The history and physical examination suggest that her cough is multifactorial with contribution from postnasal drainage. We will address these issues at this time.   Treatment plan as outlined above for chronic rhinitis.   Meds ordered this encounter  Medications  . mometasone (NASONEX) 50 MCG/ACT nasal spray    Sig: Use 1 spray per nostril 1-2 times daily as needed    Dispense:  17 g    Refill:  5    Diagnostics: Environmental skin testing: Negative, though the histamine control was borderline reactive. Food  allergen skin testing: Negative, though the histamine control was borderline  reactive. Spirometry:  Normal with an FEV1 of 101% predicted.  Please see scanned spirometry results for details.    Physical examination: Blood pressure 106/68, pulse 73, temperature 98 F (36.7 C), temperature source Oral, resp. rate 16, height 5\' 7"  (1.702 m), weight 152 lb (68.9 kg), SpO2 96 %.  General: Alert, interactive, in no acute distress. HEENT: TMs pearly gray, turbinates moderately edematous with thick discharge, post-pharynx moderately erythematous. Neck: Supple without lymphadenopathy. Lungs: Clear to auscultation without wheezing, rhonchi or rales. CV: Normal S1, S2 without murmurs. Abdomen: Nondistended, nontender. Skin: Warm and dry, without lesions or rashes. Extremities:  No clubbing, cyanosis or edema. Neuro:   Grossly intact.  Review of systems:  Review of systems negative except as noted in HPI / PMHx or noted below: Review of Systems  Constitutional: Negative.   HENT: Negative.   Eyes: Negative.   Respiratory: Negative.   Cardiovascular: Negative.   Gastrointestinal: Negative.   Genitourinary: Negative.   Musculoskeletal: Negative.   Skin: Negative.   Neurological: Negative.   Endo/Heme/Allergies: Negative.   Psychiatric/Behavioral: Negative.     Past medical history:  Past Medical History:  Diagnosis Date  . Asthma   . Eczema   . Endometriosis   . Hyperlipidemia   . Skin cancer     Past surgical history:  Past Surgical History:  Procedure Laterality Date  . ANKLE SURGERY     right ankle  . APPENDECTOMY    . KNEE SURGERY     right knee  . LAPAROTOMY      Family history: Family History  Problem Relation Age of Onset  . Diabetes Father   . Cancer Father        prostate  . Allergies Father   . Allergic rhinitis Father   . Cancer Mother        breast  . Diabetes Mother   . Aneurysm Mother        celiac  . Breast cancer Mother   . Thyroid disease Sister   . Sleep apnea Brother   . Thyroid disease Sister   . Celiac disease  Sister   . Bronchitis Sister   . Asthma Neg Hx   . Eczema Neg Hx   . Urticaria Neg Hx   . Immunodeficiency Neg Hx   . Atopy Neg Hx   . Angioedema Neg Hx     Social history: Social History   Social History  . Marital status: Married    Spouse name: N/A  . Number of children: 2  . Years of education: N/A   Occupational History  . housewife    Social History Main Topics  . Smoking status: Never Smoker  . Smokeless tobacco: Never Used  . Alcohol use No  . Drug use: No  . Sexual activity: Not on file   Other Topics Concern  . Not on file   Social History Narrative  . No narrative on file   Environmental History: Patient lives in a 64 year old house with carpeting in the bedroom, gas heat, and central air.  She is a nonsmoker without pets.  There is no known mold/water damage in the home.  Allergies as of 04/24/2017      Reactions   Clonidine Hcl    rash   Codeine Nausea Only   Erythromycin Other (See Comments)   GI upset   Tramadol    N/V      Medication List  Accurate as of 04/24/17 11:52 AM. Always use your most recent med list.          benzonatate 200 MG capsule Commonly known as:  TESSALON TK 1 C PO TID FOR 10 DAYS   D3 VITAMIN PO Take 1 tablet by mouth daily.   loratadine 10 MG tablet Commonly known as:  CLARITIN Take by mouth.   MEGARED OMEGA-3 KRILL OIL PO Take 1 tablet by mouth daily.   mometasone 50 MCG/ACT nasal spray Commonly known as:  NASONEX Use 1 spray per nostril 1-2 times daily as needed   pantoprazole 40 MG tablet Commonly known as:  PROTONIX Take 1 tablet (40 mg total) by mouth daily. Take 30-60 min before first meal of the day   triamcinolone 55 MCG/ACT Aero nasal inhaler Commonly known as:  NASACORT Place into the nose.   vitamin C 500 MG tablet Commonly known as:  ASCORBIC ACID Take 500 mg by mouth.       Known medication allergies: Allergies  Allergen Reactions  . Clonidine Hcl     rash  . Codeine  Nausea Only  . Erythromycin Other (See Comments)    GI upset  . Tramadol     N/V    I appreciate the opportunity to take part in Jesenia's care. Please do not hesitate to contact me with questions.  Sincerely,   R. Edgar Frisk, MD

## 2017-04-24 NOTE — Assessment & Plan Note (Signed)
   A prescription has been provided for Nasonex nasal spray, one spray per nostril 1-2 times daily as needed. Proper nasal spray technique has been discussed and demonstrated.  Nasal saline lavage (NeilMed) has been recommended as needed and prior to medicated nasal sprays along with instructions for proper administration.  For thick post nasal drainage, add guaifenesin [phone] mg (Mucinex)  twice daily as needed with adequate hydration as discussed.  Laboratory order form has been provided for serum specific IgE against environmental panel.

## 2017-04-24 NOTE — Assessment & Plan Note (Signed)
The most common causes of chronic cough include the following: upper airway cough syndrome (UACS) which is caused by variety of rhinosinus conditions; asthma; gastroesophageal reflux disease (GERD); chronic bronchitis from cigarette smoking or other inhaled environmental irritants; non-asthmatic eosinophilic bronchitis; and bronchiectasis. In prospective studies, these conditions have accounted for up to 94% of the causes of chronic cough in immunocompetent adults. The history and physical examination suggest that her cough is multifactorial with contribution from postnasal drainage. We will address these issues at this time.   Treatment plan as outlined above for chronic rhinitis.

## 2017-04-26 NOTE — Addendum Note (Signed)
Addended by: Felipa Emory on: 04/26/2017 08:57 AM   Modules accepted: Orders

## 2017-05-29 ENCOUNTER — Ambulatory Visit: Payer: Managed Care, Other (non HMO) | Admitting: Allergy and Immunology

## 2017-06-26 ENCOUNTER — Ambulatory Visit: Payer: Managed Care, Other (non HMO) | Admitting: Allergy & Immunology

## 2017-07-18 ENCOUNTER — Encounter: Payer: Self-pay | Admitting: Allergy and Immunology

## 2017-07-18 ENCOUNTER — Ambulatory Visit (INDEPENDENT_AMBULATORY_CARE_PROVIDER_SITE_OTHER): Payer: Managed Care, Other (non HMO) | Admitting: Allergy and Immunology

## 2017-07-18 VITALS — BP 104/60 | HR 96 | Temp 98.1°F | Resp 16

## 2017-07-18 DIAGNOSIS — J310 Chronic rhinitis: Secondary | ICD-10-CM

## 2017-07-18 DIAGNOSIS — T7840XD Allergy, unspecified, subsequent encounter: Secondary | ICD-10-CM

## 2017-07-18 DIAGNOSIS — Z91018 Allergy to other foods: Secondary | ICD-10-CM

## 2017-07-18 NOTE — Progress Notes (Signed)
Follow-up Note  RE: Allison Webb MRN: [id number] DOB: [birth year] Date of Office Visit: 07/18/2017  Primary care provider: Lawerance Cruel, MD Referring provider: Lawerance Cruel, MD  History of present illness: Allison Webb is a 67 y.o. female with possible food and environmental allergies presenting today for follow-up and allergy skin testing.  She was previously seen in this clinic for her initial evaluation on April 24, 2017.  At that time, she had recently finished prednisone and her histamine control was not reactive therefore the skin test results are uninterpretable.  She has been off of all systemic steroids and antihistamines over the past month.  Please see history of present illness from April 24, 2017 for details.   Assessment and plan: History of food allergy The patient's history suggests food allergy, however food allergen skin tests were negative today despite a positive histamine control.  The negative predictive value for food allergen skin testing is excellent, however there is still a 5% chance that the allergy exists.  A laboratory form has been provided for serum tryptase level and serum specific IgE against tree nut panel and alpha-gal panel.  Until food allergy has been definitively ruled out, continue avoidance of tree nuts and any other foods that are suspected of causing untoward symptoms.  Chronic rhinitis  Continue Nasacort AQ nasal spray, one spray per nostril 1-2 times daily as needed.   Nasal saline lavage (NeilMed) has been recommended as needed and prior to medicated nasal sprays along with instructions for proper administration.  For thick post nasal drainage, add guaifenesin [phone] mg (Mucinex)  twice daily as needed with adequate hydration as discussed.   Diagnostics: Food allergen skin testing: Negative despite a positive histamine control. Environmental allergen skin testing: Negative despite a positive histamine control.     Physical examination: Blood pressure 104/60, pulse 96, temperature 98.1 F (36.7 C), temperature source Oral, resp. rate 16.  General: Alert, interactive, in no acute distress. HEENT: TMs pearly gray, turbinates mildly edematous without discharge, post-pharynx unremarkable. Neck: Supple without lymphadenopathy. Lungs: Clear to auscultation without wheezing, rhonchi or rales. CV: Normal S1, S2 without murmurs. Skin: Warm and dry, without lesions or rashes.  The following portions of the patient's history were reviewed and updated as appropriate: allergies, current medications, past family history, past medical history, past social history, past surgical history and problem list.  Allergies as of 07/18/2017      Reactions   Clonidine Hcl    rash   Codeine Nausea Only   Erythromycin Other (See Comments)   GI upset   Tramadol    N/V      Medication List        Accurate as of 07/18/17  3:15 PM. Always use your most recent med list.          benzonatate 200 MG capsule Commonly known as:  TESSALON TK 1 C PO TID FOR 10 DAYS   D3 VITAMIN PO Take 1 tablet by mouth daily.   loratadine 10 MG tablet Commonly known as:  CLARITIN Take by mouth.   MEGARED OMEGA-3 KRILL OIL PO Take 1 tablet by mouth daily.   mometasone 50 MCG/ACT nasal spray Commonly known as:  NASONEX Use 1 spray per nostril 1-2 times daily as needed   pantoprazole 40 MG tablet Commonly known as:  PROTONIX Take 1 tablet (40 mg total) by mouth daily. Take 30-60 min before first meal of the day   triamcinolone 55 MCG/ACT Aero nasal  inhaler Commonly known as:  NASACORT Place into the nose.   vitamin C 500 MG tablet Commonly known as:  ASCORBIC ACID Take 500 mg by mouth.       Allergies  Allergen Reactions  . Clonidine Hcl     rash  . Codeine Nausea Only  . Erythromycin Other (See Comments)    GI upset  . Tramadol     N/V    I appreciate the opportunity to take part in [facility]'s care. Please  do not hesitate to contact me with questions.  Sincerely,   R. Edgar Frisk, MD

## 2017-07-18 NOTE — Assessment & Plan Note (Addendum)
   Continue Nasacort AQ nasal spray, one spray per nostril 1-2 times daily as needed.   Nasal saline lavage (NeilMed) has been recommended as needed and prior to medicated nasal sprays along with instructions for proper administration.  For thick post nasal drainage, add guaifenesin [phone] mg (Mucinex)  twice daily as needed with adequate hydration as discussed.

## 2017-07-18 NOTE — Patient Instructions (Signed)
History of food allergy The patient's history suggests food allergy, however food allergen skin tests were negative today despite a positive histamine control.  The negative predictive value for food allergen skin testing is excellent, however there is still a 5% chance that the allergy exists.  A laboratory form has been provided for serum tryptase level and serum specific IgE against tree nut panel and alpha-gal panel.  Until food allergy has been definitively ruled out, continue avoidance of tree nuts and any other foods that are suspected of causing untoward symptoms.  Chronic rhinitis  Continue Nasacort AQ nasal spray, one spray per nostril 1-2 times daily as needed.   Nasal saline lavage (NeilMed) has been recommended as needed and prior to medicated nasal sprays along with instructions for proper administration.  For thick post nasal drainage, add guaifenesin [phone] mg (Mucinex)  twice daily as needed with adequate hydration as discussed.   When lab results have returned the patient will be called with further recommendations and follow up instructions.

## 2017-07-18 NOTE — Assessment & Plan Note (Addendum)
The patient's history suggests food allergy, however food allergen skin tests were negative today despite a positive histamine control.  The negative predictive value for food allergen skin testing is excellent, however there is still a 5% chance that the allergy exists.  A laboratory form has been provided for serum tryptase level and serum specific IgE against tree nut panel and alpha-gal panel.  Until food allergy has been definitively ruled out, continue avoidance of tree nuts and any other foods that are suspected of causing untoward symptoms.

## 2017-07-23 LAB — ALPHA-GAL PANEL
Alpha Gal IgE*: <0.1 kU/L (ref <0.10)
Beef (Bos spp) IgE: <0.1 kU/L (ref <0.35)
Class Interpretation: 0
Class Interpretation: 0
Class Interpretation: 0
Lamb/Mutton (Ovis spp) IgE: <0.1 kU/L (ref <0.35)
Pork (Sus spp) IgE: <0.1 kU/L (ref <0.35)

## 2017-07-23 LAB — ALLERGENS(7)
Brazil Nut IgE: <0.1 kU/L
F020-IgE Almond: <0.1 kU/L
F202-IgE Cashew Nut: <0.1 kU/L
Hazelnut (Filbert) IgE: <0.1 kU/L
Peanut IgE: <0.1 kU/L
Pecan Nut IgE: <0.1 kU/L
Walnut IgE: <0.1 kU/L

## 2017-07-23 LAB — ALLERGEN COCONUT IGE: Allergen Coconut IgE: <0.1 kU/L

## 2017-07-23 LAB — TRYPTASE: Tryptase: 4.5 ug/L (ref 2.2–13.2)

## 2017-07-23 LAB — ALLERGEN PISTACHIO F203: F203-IgE Pistachio Nut: <0.1 kU/L

## 2017-09-30 ENCOUNTER — Other Ambulatory Visit: Payer: Self-pay | Admitting: Obstetrics & Gynecology

## 2017-09-30 DIAGNOSIS — Z1231 Encounter for screening mammogram for malignant neoplasm of breast: Secondary | ICD-10-CM

## 2017-10-29 ENCOUNTER — Ambulatory Visit: Payer: Managed Care, Other (non HMO)

## 2017-11-05 ENCOUNTER — Ambulatory Visit
Admission: RE | Admit: 2017-11-05 | Discharge: 2017-11-05 | Disposition: A | Discharge status: Home / Self Care | Payer: Managed Care, Other (non HMO) | Source: Ambulatory Visit | Attending: Obstetrics & Gynecology | Admitting: Obstetrics & Gynecology

## 2017-11-05 ENCOUNTER — Ambulatory Visit: Payer: Managed Care, Other (non HMO)

## 2017-11-05 DIAGNOSIS — Z1231 Encounter for screening mammogram for malignant neoplasm of breast: Secondary | ICD-10-CM

## 2017-11-20 ENCOUNTER — Ambulatory Visit: Payer: Managed Care, Other (non HMO)

## 2018-11-28 ENCOUNTER — Other Ambulatory Visit: Payer: Self-pay | Admitting: Obstetrics & Gynecology

## 2018-11-28 DIAGNOSIS — Z1231 Encounter for screening mammogram for malignant neoplasm of breast: Secondary | ICD-10-CM

## 2019-01-09 IMAGING — US ULTRASOUND RIGHT BREAST LIMITED
1 series · 5 of 5 positions shown · non-contrast
Comparison: Previous exam(s).

CLINICAL DATA: 65-year-old female presenting for evaluation of a
possible right breast mass.

EXAM:
2D DIGITAL DIAGNOSTIC UNILATERAL RIGHT MAMMOGRAM WITH CAD AND
ADJUNCT TOMO
RIGHT BREAST ULTRASOUND

[Series 1: ultrasound right breast limited · 0.06mm/px · 5 of 5 slices shown]
[im 1/5]
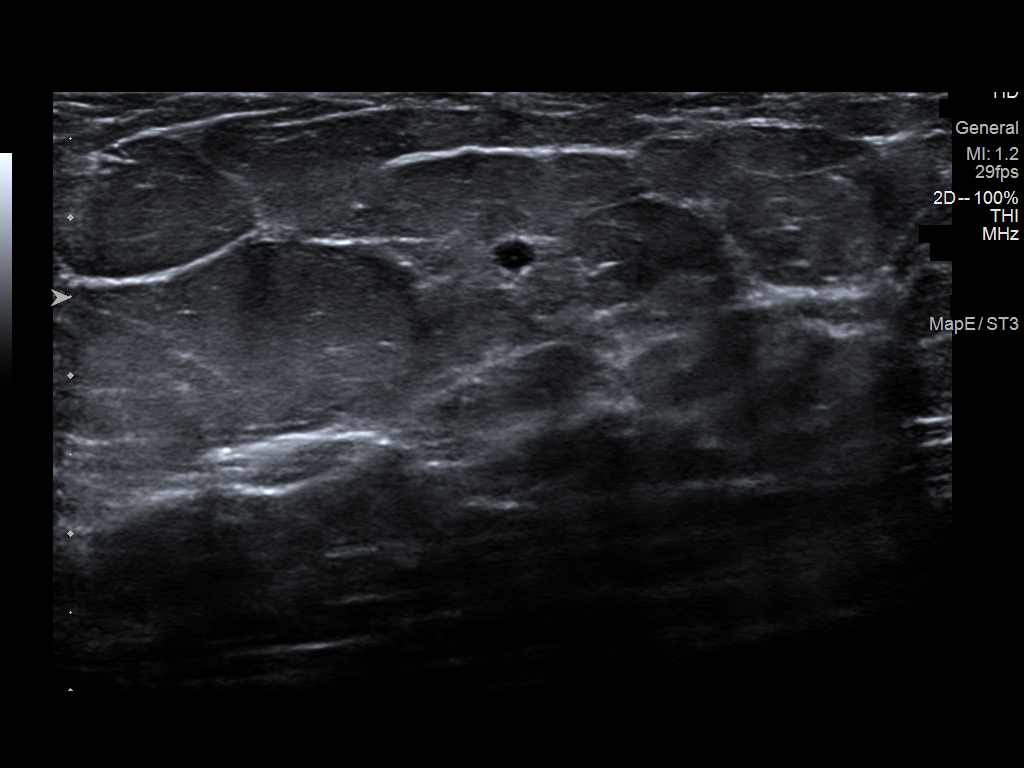
[im 2/5]
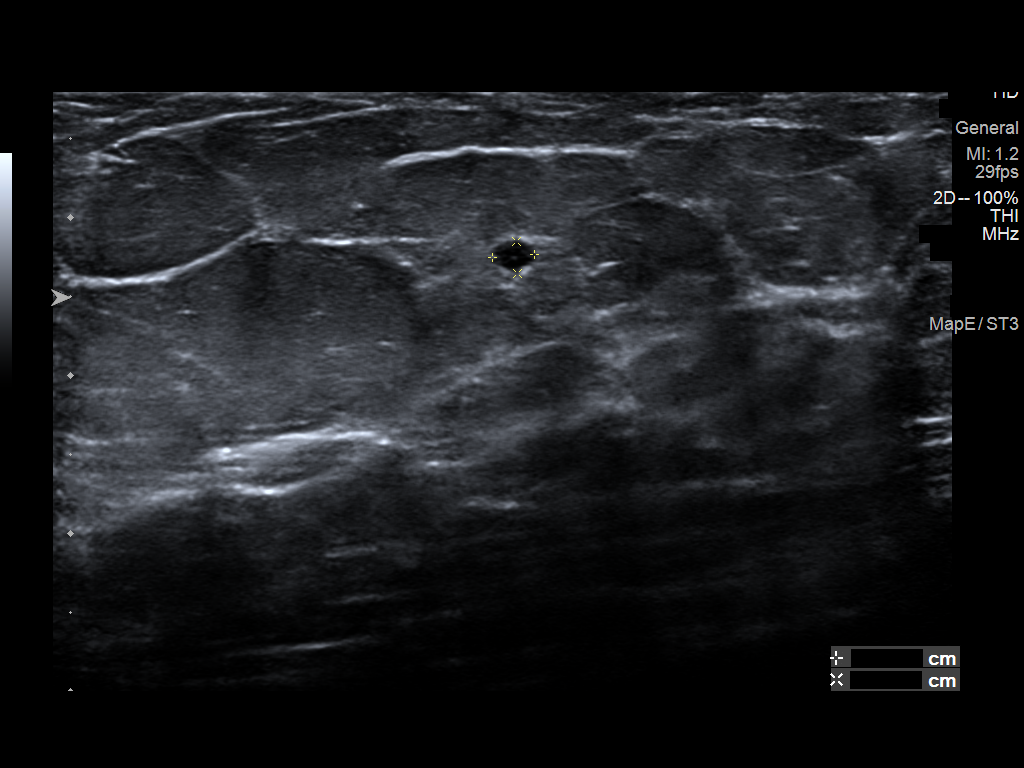
[im 3/5]
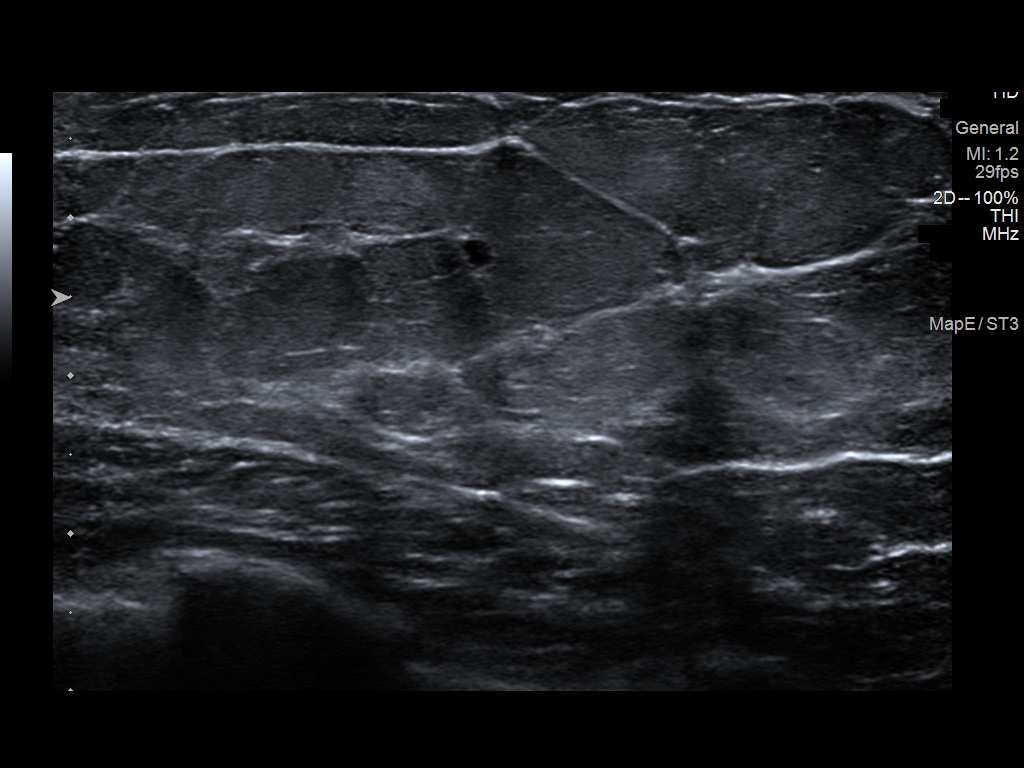
[im 4/5]
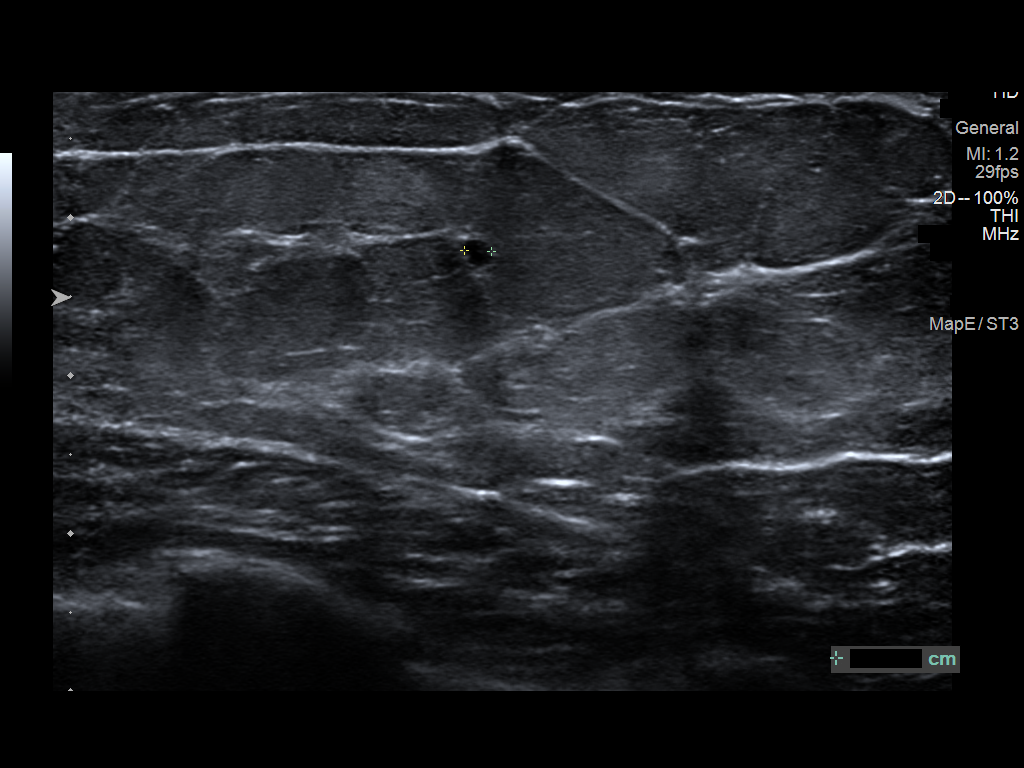
[im 5/5]
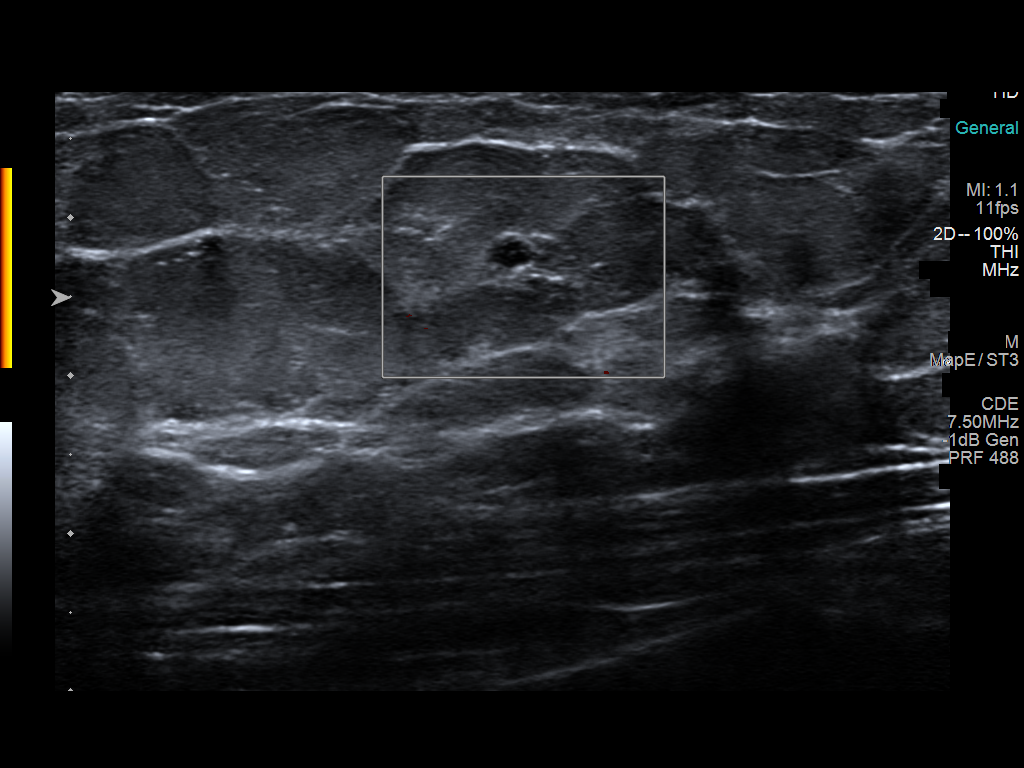

[5 of 5 positions shown; findings below may reference images not displayed]

ACR Breast Density Category b: There are scattered areas of
fibroglandular density.
FINDINGS: In the central right breast, posterior depth there is a
circumscribed oval mass measuring approximately 4 mm.

Mammographic images were processed with CAD.

Ultrasound targeted to the right breast at 1 o'clock, 2 cm from the
nipple demonstrates a small anechoic circumscribed mass measuring 3
x 2 x 2 mm, compatible with a cyst.
IMPRESSION: The mass identified mammographically in the right breast corresponds
with a benign cyst.

RECOMMENDATION:
Screening mammogram in one year.(Code:BZ-3-PG9)

I have discussed the findings and recommendations with the patient.
Results were also provided in writing at the conclusion of the
visit. If applicable, a reminder letter will be sent to the patient
regarding the next appointment.

BI-RADS CATEGORY  2: Benign.

## 2019-01-09 IMAGING — MG 2D DIGITAL DIAGNOSTIC UNILATERAL RIGHT MAMMOGRAM WITH CAD AND AD
4 series · 4 of 12 positions shown · non-contrast
Comparison: Previous exam(s).

CLINICAL DATA: 65-year-old female presenting for evaluation of a
possible right breast mass.

EXAM:
2D DIGITAL DIAGNOSTIC UNILATERAL RIGHT MAMMOGRAM WITH CAD AND
ADJUNCT TOMO
RIGHT BREAST ULTRASOUND

[R MLO]
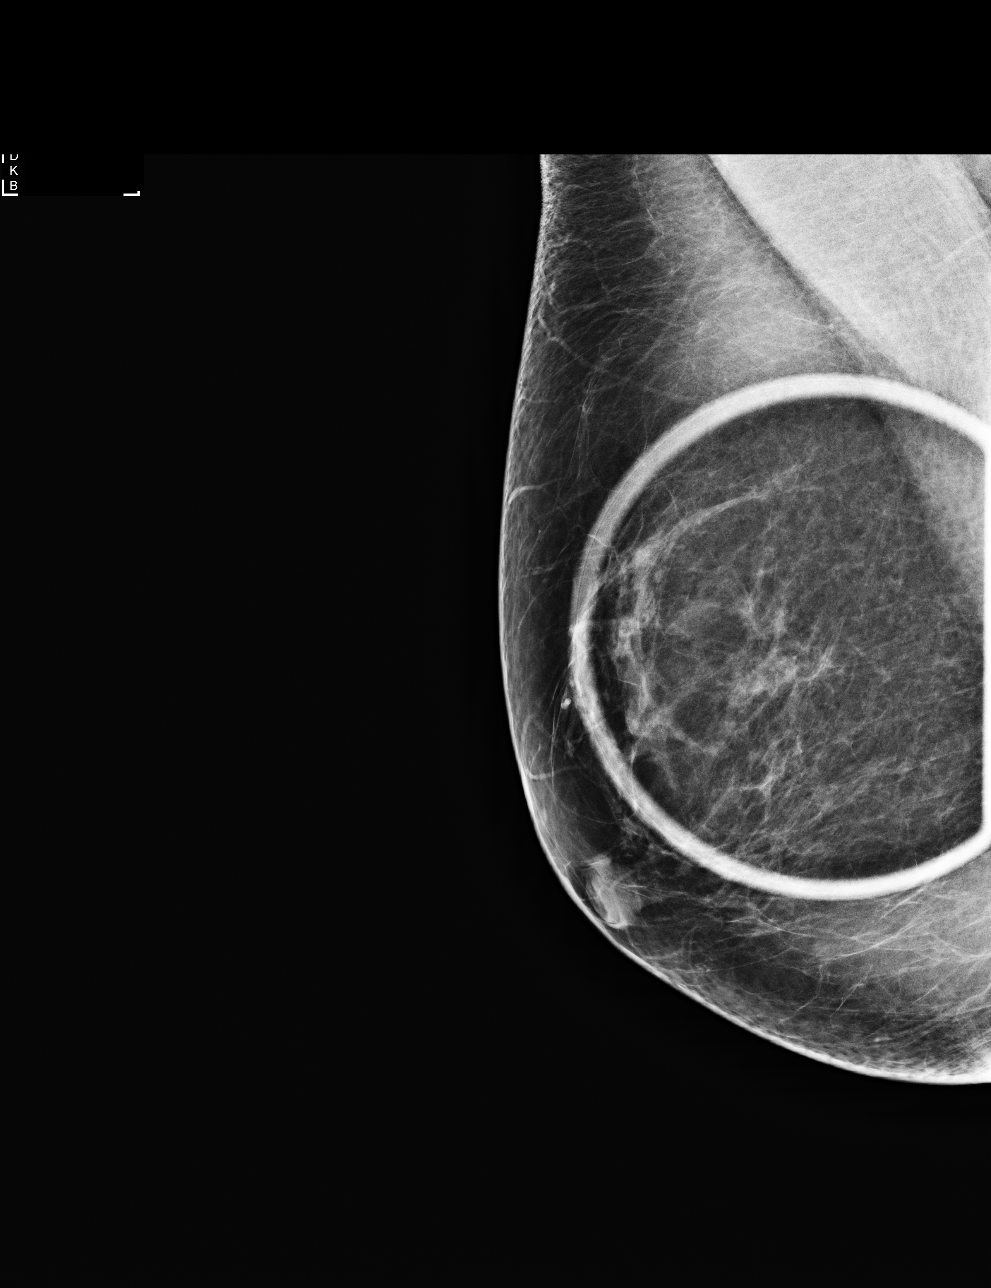

[R CC]
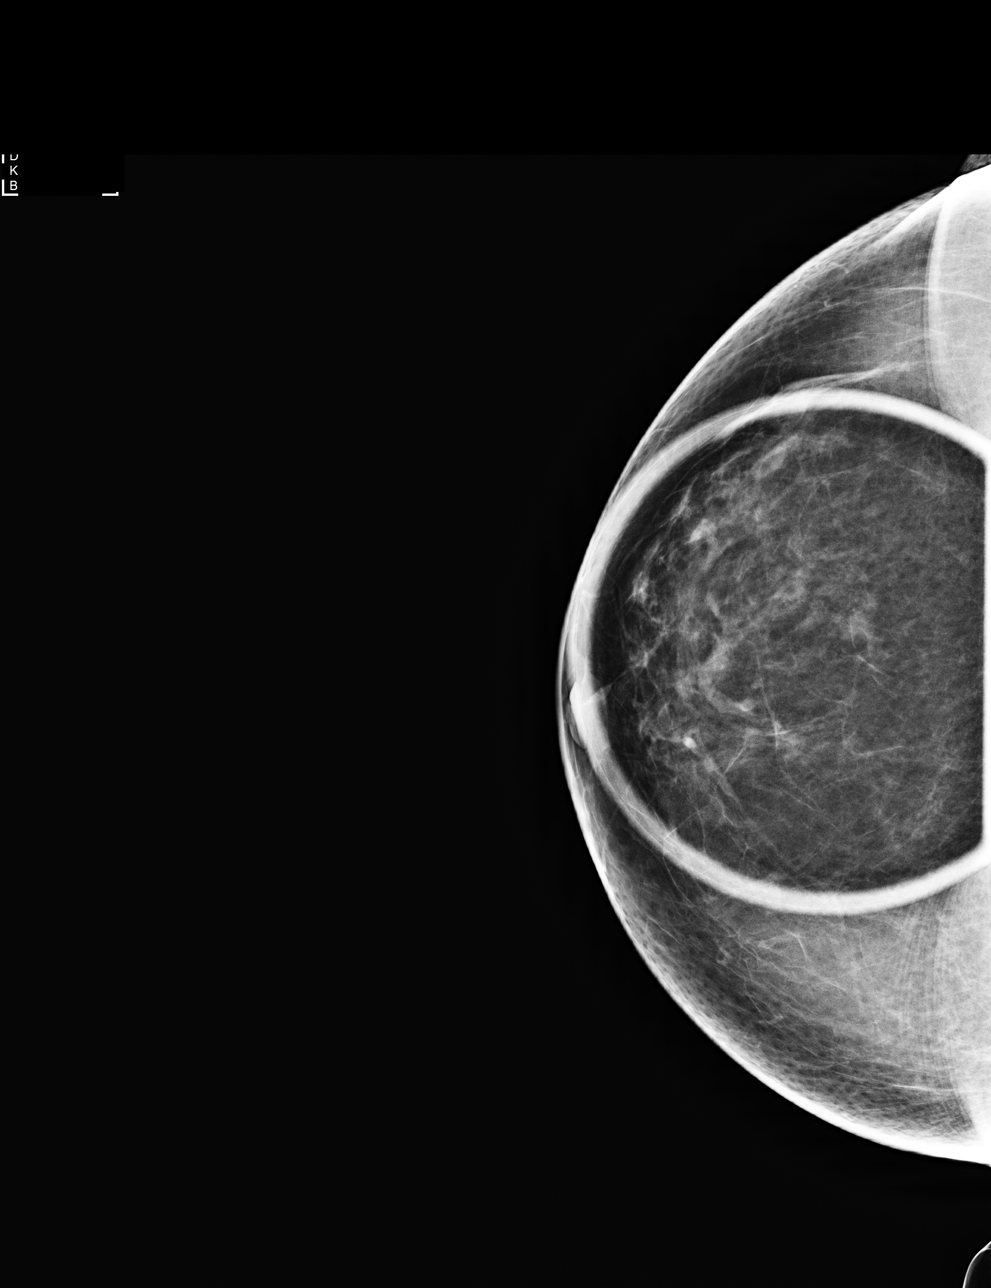

[R CC tomo · tomo slice 35/68.0]
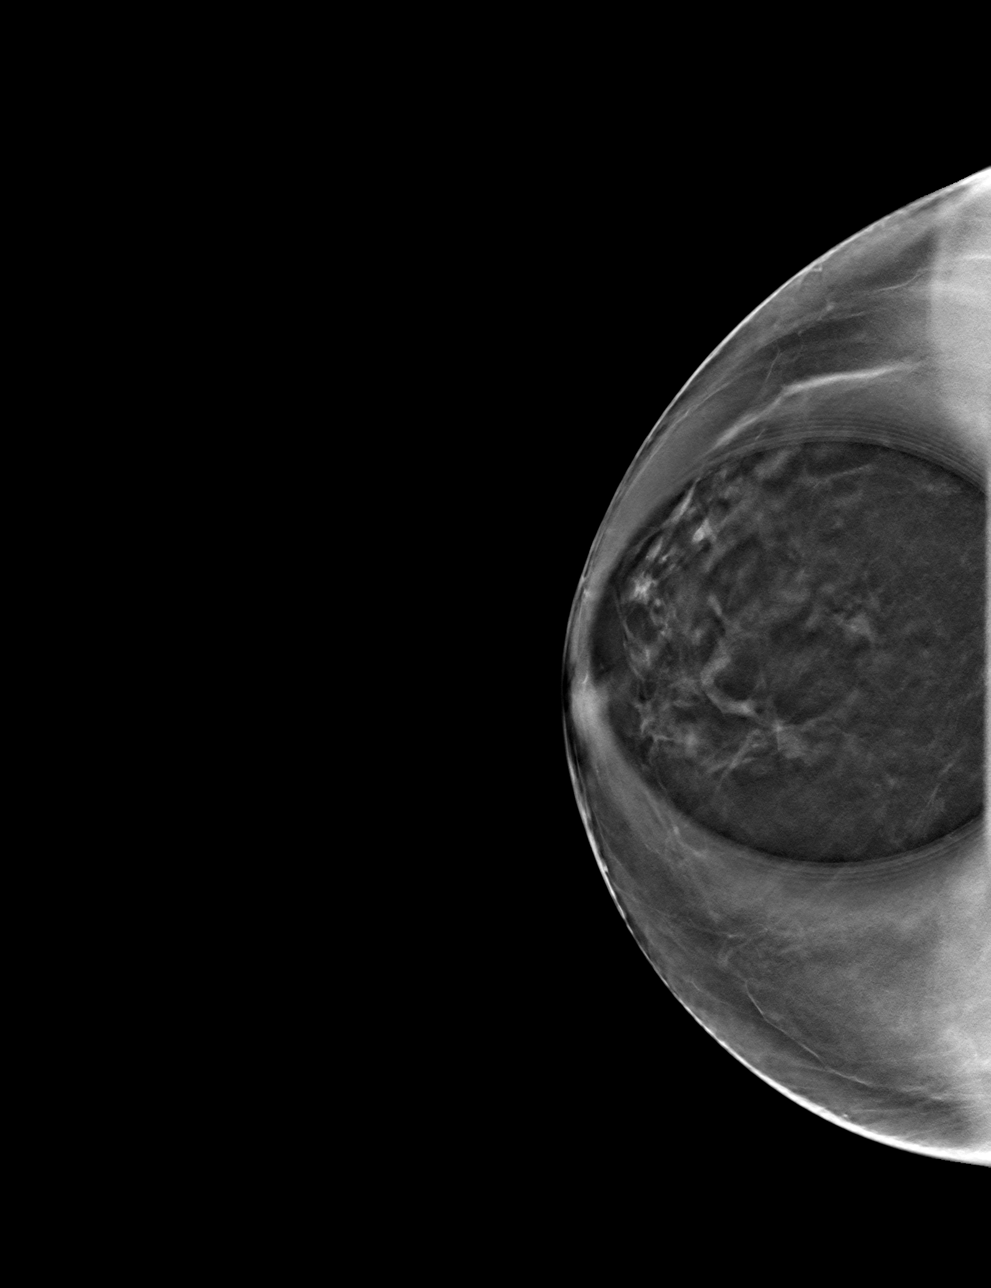

[R MLO tomo · tomo slice 34/67.0]
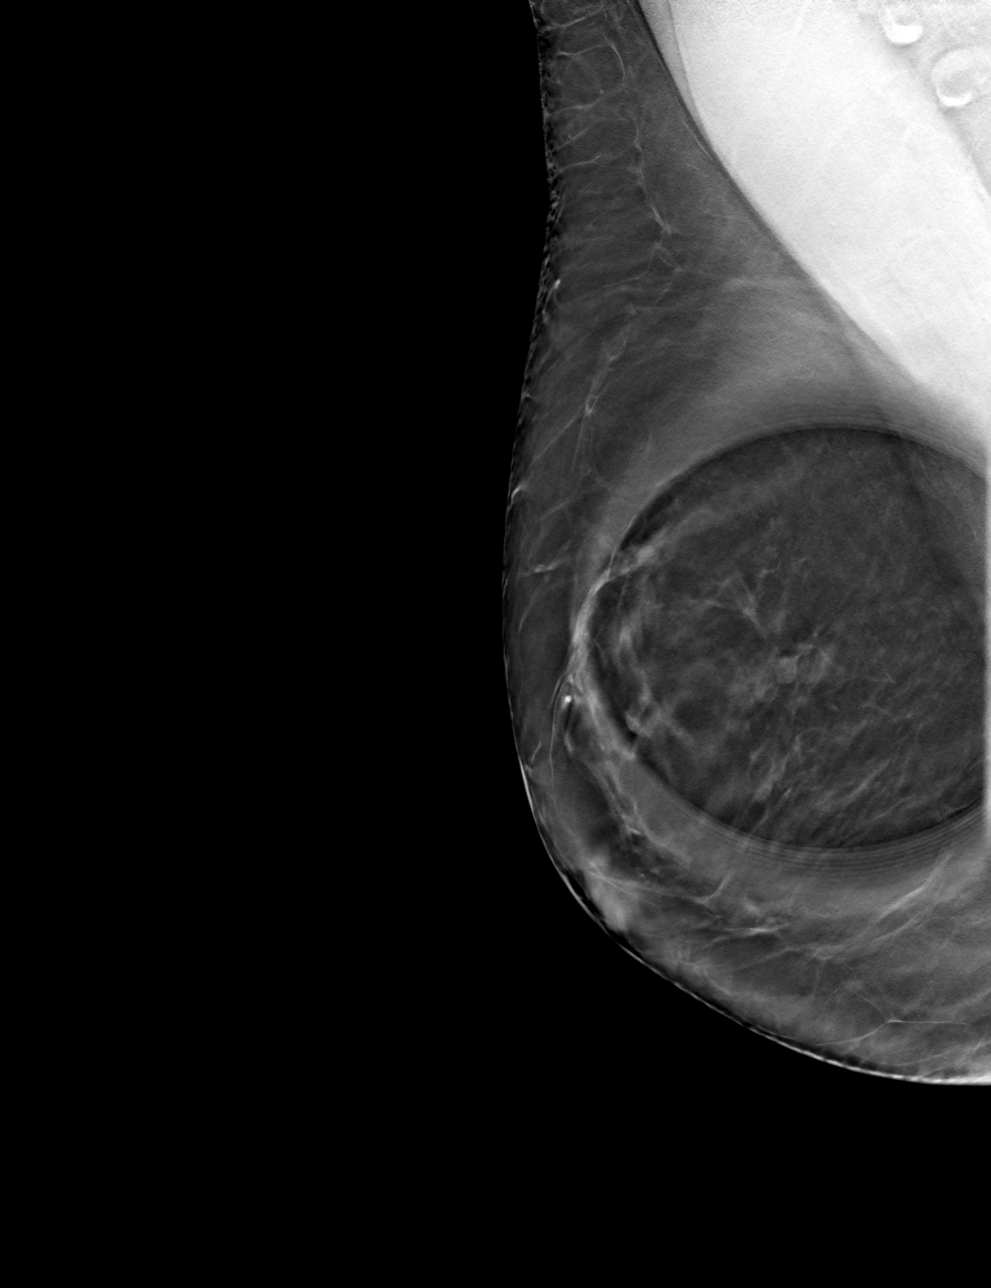

[4 of 12 positions shown; findings below may reference images not displayed]

ACR Breast Density Category b: There are scattered areas of
fibroglandular density.
FINDINGS: In the central right breast, posterior depth there is a
circumscribed oval mass measuring approximately 4 mm.

Mammographic images were processed with CAD.

Ultrasound targeted to the right breast at 1 o'clock, 2 cm from the
nipple demonstrates a small anechoic circumscribed mass measuring 3
x 2 x 2 mm, compatible with a cyst.
IMPRESSION: The mass identified mammographically in the right breast corresponds
with a benign cyst.

RECOMMENDATION:
Screening mammogram in one year.(Code:BZ-3-PG9)

I have discussed the findings and recommendations with the patient.
Results were also provided in writing at the conclusion of the
visit. If applicable, a reminder letter will be sent to the patient
regarding the next appointment.

BI-RADS CATEGORY  2: Benign.

## 2019-01-27 ENCOUNTER — Other Ambulatory Visit: Payer: Self-pay

## 2019-01-27 ENCOUNTER — Ambulatory Visit
Admission: RE | Admit: 2019-01-27 | Discharge: 2019-01-27 | Disposition: A | Discharge status: Home / Self Care | Payer: Managed Care, Other (non HMO) | Source: Ambulatory Visit | Attending: Obstetrics & Gynecology | Admitting: Obstetrics & Gynecology

## 2019-01-27 DIAGNOSIS — Z1231 Encounter for screening mammogram for malignant neoplasm of breast: Secondary | ICD-10-CM

## 2020-01-22 ENCOUNTER — Emergency Department (HOSPITAL_BASED_OUTPATIENT_CLINIC_OR_DEPARTMENT_OTHER): Payer: Managed Care, Other (non HMO)

## 2020-01-22 ENCOUNTER — Emergency Department (HOSPITAL_BASED_OUTPATIENT_CLINIC_OR_DEPARTMENT_OTHER)
Admission: EM | Admit: 2020-01-22 | Discharge: 2020-01-22 | Disposition: A | Discharge status: Home / Self Care | Payer: Managed Care, Other (non HMO) | Attending: Emergency Medicine | Admitting: Emergency Medicine

## 2020-01-22 ENCOUNTER — Other Ambulatory Visit: Payer: Self-pay

## 2020-01-22 ENCOUNTER — Encounter (HOSPITAL_BASED_OUTPATIENT_CLINIC_OR_DEPARTMENT_OTHER): Payer: Self-pay | Admitting: *Deleted

## 2020-01-22 DIAGNOSIS — Y929 Unspecified place or not applicable: Secondary | ICD-10-CM | POA: Diagnosis not present

## 2020-01-22 DIAGNOSIS — Z79899 Other long term (current) drug therapy: Secondary | ICD-10-CM | POA: Diagnosis not present

## 2020-01-22 DIAGNOSIS — W230XXA Caught, crushed, jammed, or pinched between moving objects, initial encounter: Secondary | ICD-10-CM | POA: Diagnosis not present

## 2020-01-22 DIAGNOSIS — Y939 Activity, unspecified: Secondary | ICD-10-CM | POA: Insufficient documentation

## 2020-01-22 DIAGNOSIS — S6991XA Unspecified injury of right wrist, hand and finger(s), initial encounter: Secondary | ICD-10-CM

## 2020-01-22 DIAGNOSIS — Y999 Unspecified external cause status: Secondary | ICD-10-CM | POA: Diagnosis not present

## 2020-01-22 DIAGNOSIS — Z85828 Personal history of other malignant neoplasm of skin: Secondary | ICD-10-CM | POA: Insufficient documentation

## 2020-01-22 DIAGNOSIS — J45909 Unspecified asthma, uncomplicated: Secondary | ICD-10-CM | POA: Insufficient documentation

## 2020-01-22 DIAGNOSIS — Z23 Encounter for immunization: Secondary | ICD-10-CM | POA: Insufficient documentation

## 2020-01-22 HISTORY — DX: Unspecified asthma, uncomplicated: J45.909

## 2020-01-22 MED ORDER — ONDANSETRON 4 MG PO TBDP: 4.0000 mg | ORAL_TABLET | Freq: Three times a day (TID) | ORAL | 0 refills | Status: DC | PRN

## 2020-01-22 MED ORDER — HYDROCODONE-ACETAMINOPHEN 5-325 MG PO TABS: 1.0000 | ORAL_TABLET | Freq: Four times a day (QID) | ORAL | 0 refills | Status: DC | PRN

## 2020-01-22 MED ORDER — CEPHALEXIN 500 MG PO CAPS: 500.0000 mg | ORAL_CAPSULE | Freq: Four times a day (QID) | ORAL | 0 refills | Status: AC

## 2020-01-22 MED ORDER — LIDOCAINE-EPINEPHRINE 1 %-1:100000 IJ SOLN
INTRAMUSCULAR | Status: AC
  Administered 2020-01-22: 1 mL
  Filled 2020-01-22: qty 1

## 2020-01-22 MED ORDER — TETANUS-DIPHTH-ACELL PERTUSSIS 5-2.5-18.5 LF-MCG/0.5 IM SUSP
0.5000 mL | Freq: Once | INTRAMUSCULAR | Status: AC
  Administered 2020-01-22: 0.5 mL via INTRAMUSCULAR
  Filled 2020-01-22: qty 0.5

## 2020-01-22 MED ORDER — BUPIVACAINE HCL (PF) 0.5 % IJ SOLN
5.0000 mL | Freq: Once | INTRAMUSCULAR | Status: AC
  Administered 2020-01-22: 5 mL
  Filled 2020-01-22: qty 10

## 2020-01-22 MED ORDER — LIDOCAINE-EPINEPHRINE (PF) 2 %-1:200000 IJ SOLN: 5.0000 mL | Freq: Once | INTRAMUSCULAR | Status: DC

## 2020-01-22 MED FILL — CEPHALEXIN 500 MG CAPSULE: 500 | 7 days supply | Qty: 28 | Fill #0

## 2020-01-22 NOTE — Discharge Instructions (Addendum)
I have given you a prescription for Keflex for 7 days.   This antibiotic is normally very well-tolerated.  As we discussed given that this is a puncture wound in the hand there is a high possibility of infection.  It is important that you take all the antibiotics.    I would recommend scheduling a follow-up appointment with your primary care doctor on Monday for a wound recheck if possible.  If you get red streaking, fevers or have other concerns please seek additional medical care and evaluation.  I have given you the information for hand specialist to you have it if you need it.  The numbing medicine may work for up to 12 hours.    Please take Ibuprofen (Advil, motrin) and Tylenol (acetaminophen) to relieve your pain.  I would recommend using Tylenol first, if that is not controlling your pain at maximum doses then he may add in ibuprofen.  You may take up to 600 MG (3 pills) of normal strength ibuprofen every 8 hours as needed.  In between doses of ibuprofen you make take tylenol, up to 1,000 mg (two extra strength pills).  Do not take more than 3,000 mg tylenol in a 24 hour period.  Please check all medication labels as many medications such as pain and cold medications may contain tylenol.  Your prescription pain medication contains Tylenol.  Do not drink alcohol while taking these medications.  Do not take other NSAID'S while taking ibuprofen (such as aleve or naproxen).  Please take ibuprofen with food to decrease stomach upset.   You may have diarrhea from the antibiotics.  It is very important that you continue to take the antibiotics even if you get diarrhea unless a medical professional tells you that you may stop taking them.  If you stop too early the bacteria you are being treated for will become stronger and you may need different, more powerful antibiotics that have more side effects and worsening diarrhea.  Please stay well hydrated and consider probiotics as they may decrease the severity  of your diarrhea.   You are being prescribed a medication which may make you sleepy. For 24 hours after one dose please do not drive, operate heavy machinery, care for a small child with out another adult present, or perform any activities that may cause harm to you or someone else if you were to fall asleep or be impaired.

## 2020-01-22 NOTE — ED Notes (Signed)
Fish hook out of  Rt hand per  PA

## 2020-01-22 NOTE — ED Triage Notes (Signed)
Pt states she was retrieving keys from her sister's house and her right hand was caught on a fishhook

## 2020-01-22 NOTE — ED Provider Notes (Signed)
[facility] EMERGENCY DEPARTMENT Provider Note   CSN: [id number] Arrival date & time: 01/22/20  1024     History Chief Complaint  Patient presents with  . Foreign Body in [place] is a 69 y.o. female with past medical history of asthma, eczema, unsure when last tetanus shot was, who presents today for evaluation of a fishhook in her right hand.  She reports that she was getting her keys at her sister's house when she caught the side of her right hand on the fishhook.  Sister does not know if the fishhook was ever used or not, however does not think it has been used in the past year.  Patient denies any numbness or tingling.  She denies any weakness.  She reports that multiple antibiotics make her nauseous and cause GI upset, however she denies any rashes or true allergic reactions.   She is right handed.   HPI     Past Medical History:  Diagnosis Date  . Asthma    Pt states she does not have asathma  . Eczema   . Endometriosis   . Hyperlipidemia   . Skin cancer     Patient Active Problem List   Diagnosis Date Noted  . History of food allergy 04/24/2017  . Allergic reaction 04/24/2017  . Cough, persistent 05/17/2014  . Chest pain 01/18/2011  . Hyperlipidemia 01/18/2011  . Chronic rhinitis 09/09/2007  . ACUTE BRONCHOSPASM 09/09/2007  . ENDOMETRIOSIS 09/09/2007  . SKIN CANCER, HX OF 09/09/2007    Past Surgical History:  Procedure Laterality Date  . ANKLE SURGERY     right ankle  . APPENDECTOMY    . KNEE SURGERY     right knee  . LAPAROTOMY       OB History   No obstetric history on file.     Family History  Problem Relation Age of Onset  . Diabetes Father   . Cancer Father        prostate  . Allergies Father   . Allergic rhinitis Father   . Cancer Mother        breast  . Diabetes Mother   . Aneurysm Mother        celiac  . Breast cancer Mother 46  . Thyroid disease Sister   . Sleep apnea Brother   . Thyroid disease  Sister   . Celiac disease Sister   . Bronchitis Sister   . Asthma Neg Hx   . Eczema Neg Hx   . Urticaria Neg Hx   . Immunodeficiency Neg Hx   . Atopy Neg Hx   . Angioedema Neg Hx     Social History   Tobacco Use  . Smoking status: Never Smoker  . Smokeless tobacco: Never Used  Vaping Use  . Vaping Use: Never used  Substance Use Topics  . Alcohol use: No    Alcohol/week: 0.0 standard drinks  . Drug use: No    Home Medications Prior to Admission medications   Medication Sig Start Date End Date Taking? Authorizing Provider  benzonatate (TESSALON) 200 MG capsule TK 1 C PO TID FOR 10 DAYS 03/27/17   [provider]  cephALEXin (KEFLEX) 500 MG capsule Take 1 capsule (500 mg total) by mouth 4 (four) times daily for 7 days. 01/22/20 01/29/20  Lorin Glass, PA-C  Cholecalciferol (D3 VITAMIN PO) Take 1 tablet by mouth daily.    [provider]  HYDROcodone-acetaminophen (NORCO/VICODIN) 5-325 MG tablet Take  1 tablet by mouth every 6 (six) hours as needed for severe pain. 01/22/20   Lorin Glass, PA-C  loratadine (CLARITIN) 10 MG tablet Take by mouth.    [provider]  MEGARED OMEGA-3 KRILL OIL PO Take 1 tablet by mouth daily.    [provider]  mometasone (NASONEX) 50 MCG/ACT nasal spray Use 1 spray per nostril 1-2 times daily as needed 04/24/17   Bobbitt, [place] Muta, MD  ondansetron (ZOFRAN ODT) 4 MG disintegrating tablet Take 1 tablet (4 mg total) by mouth every 8 (eight) hours as needed for nausea or vomiting. 01/22/20   Lorin Glass, PA-C  pantoprazole (PROTONIX) 40 MG tablet Take 1 tablet (40 mg total) by mouth daily. Take 30-60 min before first meal of the day 06/08/14   Tanda Rockers, MD  triamcinolone (NASACORT) 55 MCG/ACT AERO nasal inhaler Place into the nose.    [provider]  vitamin C (ASCORBIC ACID) 500 MG tablet Take 500 mg by mouth.    [provider]    Allergies    Clonidine hcl, Codeine,  Erythromycin, and Tramadol  Review of Systems   Review of Systems  Constitutional: Negative for chills and fever.  Skin: Positive for wound.  Neurological: Negative for weakness and numbness.  All other systems reviewed and are negative.   Physical Exam Updated Vital Signs BP 124/78 (BP Location: Right Arm)   Pulse 81   Temp 98.6 F (37 C) (Oral)   Resp 16   Ht 5\' 7"  (1.702 m)   Wt 65.8 kg   SpO2 100%   BMI 22.71 kg/m   Physical Exam Vitals and nursing note reviewed.  Constitutional:      General: She is not in acute distress.    Appearance: She is well-developed. She is not ill-appearing or diaphoretic.  HENT:     Head: Normocephalic and atraumatic.  Eyes:     General: No scleral icterus.       Right eye: No discharge.        Left eye: No discharge.     Conjunctiva/sclera: Conjunctivae normal.  Cardiovascular:     Rate and Rhythm: Normal rate and regular rhythm.     Pulses: Normal pulses.     Comments: Fingers on right hand have brisk capilary refill.  Pulmonary:     Effort: Pulmonary effort is normal. No respiratory distress.     Breath sounds: No stridor.  Abdominal:     General: There is no distension.  Musculoskeletal:        General: No deformity.     Cervical back: Normal range of motion.     Comments: Intact strength and ROM of right hand.   Skin:    General: Skin is warm and dry.  Neurological:     General: No focal deficit present.     Mental Status: She is alert.     Motor: No abnormal muscle tone.     Comments: Sensation intact to light touch to right hand  Psychiatric:        Behavior: Behavior normal.         ED Results / Procedures / Treatments   Labs (all labs ordered are listed, but only abnormal results are displayed) Labs Reviewed - No data to display  EKG None  Radiology DG Hand 2 View Right  Result Date: 01/22/2020 CLINICAL DATA:  Fishhook stuck in hand. EXAM: RIGHT HAND - 2 VIEW COMPARISON:  None FINDINGS: A fish hook is  identified within the volar soft tissues adjacent to the fifth MCP joint. No underlying osseous abnormality. The remaining soft tissues are unremarkable. IMPRESSION: 1. Fish hook is identified imbedded within the volar soft tissues adjacent to the fifth MCP joint. 2. No underlying osseous abnormality. Electronically Signed   By: Kerby Moors M.D.   On: 01/22/2020 11:39    Procedures .Foreign Body Removal  Date/Time: 01/22/2020 5:56 PM Performed by: Lorin Glass, PA-C Authorized by: Lorin Glass, PA-C  Consent: Verbal consent obtained. Risks and benefits: risks, benefits and alternatives were discussed (We discussed possibility for damage to additional structures in the process of removing fishhook, need for additional procedures, infection, pain, allergic/adverse reaction to any medications or products.) Consent given by: patient Body area: skin General location: upper extremity Location details: right hand Anesthesia: local infiltration  Anesthesia: Local Anesthetic: lidocaine 1% with epinephrine and bupivacaine 0.5% without epinephrine (50/50 mix) Patient cooperative: yes Localization method: visualized (x-ray) Removal mechanism: Fish hook was cut with wire cutter.  Fishhook and skin wounds extensively cleaned multiple times with chlorhexadine.   Tendon involvement: none Complexity: simple 1 objects recovered. Objects recovered: Fish hook Post-procedure assessment: foreign body removed Patient tolerance: patient tolerated the procedure well with no immediate complications Comments: Fishhook was forced towards the skin away from vital structures, scalpel was used to make a small nick to allow the barb to pass, then fishhook was pulled through the wound after it had been extensively cleaned multiple times with chlorhexidine.  Prior to cleaning fishhook was curved to allow for easier passage.  After the wounds were extensively irrigated with sterile saline using a angio  cath and syringe.  Each puncture was flushed gently and easily with out difficulty.   After procedure patient remained neurovascularly intact with full active range of motion, and minimal /local decreased sensation to light touch attributable to local anesthetic.    (including critical care time)  Medications Ordered in ED Medications  lidocaine-EPINEPHrine (XYLOCAINE W/EPI) 2 %-1:200000 (PF) injection 5 mL (5 mLs Infiltration Not Given 01/22/20 1054)  bupivacaine (MARCAINE) 0.5 % injection 5 mL (5 mLs Infiltration Given 01/22/20 1053)  lidocaine-EPINEPHrine (XYLOCAINE W/EPI) 1 %-1:100000 (with pres) injection (1 mL  Given 01/22/20 1053)  Tdap (BOOSTRIX) injection 0.5 mL (0.5 mLs Intramuscular Given 01/22/20 1209)    ED Course  I have reviewed the triage vital signs and the nursing notes.  Pertinent labs & imaging results that were available during my care of the patient were reviewed by me and considered in my medical decision making (see chart for details).    MDM Rules/Calculators/A&P                         Patient is a 69 year old woman who presents today for evaluation of a fishhook embedded in the lateral aspect of her right hand.  X-rays were obtained to evaluate the size of the embedded fishhook, location, and osseous structures.  No obvious osseous involvement.  She is neurovascularly intact.  X-ray shows fishhook appears to be superficial.  Patient and I had discussion about risks, benefits and alternatives.  She gave verbal consent for procedure.  Fishhook was removed.  Care was used to cleanse the fishhook multiple times with chlorhexidine prior to removal to minimize introduction of any new bacteria.  Wound was able to be gently flushed using an Angiocath, we discussed the importance of antibiotics as this is a infection from the wound given the puncture nature.  Her  tetanus is updated.  Commended PCP follow-up on Monday for wound recheck, or sooner in the emergency room if any  concerns.  She is given a prescription, after [place] PMP is consulted, for a short course of Vicodin for pain as needed.  As needed for stroke has not been used in the past year, has no salt water exposures known, does not appear rusty, no indication for salt water coverage.  Wound care as discussed.  Return precautions were discussed with patient who states their understanding.  At the time of discharge patient denied any unaddressed complaints or concerns.  Patient is agreeable for discharge home.  Note: Portions of this report may have been transcribed using voice recognition software. Every effort was made to ensure accuracy; however, inadvertent computerized transcription errors may be present  Final Clinical Impression(s) / ED Diagnoses Final diagnoses:  Fish hook injury of right hand, initial encounter    Rx / DC Orders ED Discharge Orders         Ordered    cephALEXin (KEFLEX) 500 MG capsule  4 times daily     Discontinue  Reprint     01/22/20 1208    HYDROcodone-acetaminophen (NORCO/VICODIN) 5-325 MG tablet  Every 6 hours PRN     Discontinue  Reprint     01/22/20 1209    ondansetron (ZOFRAN ODT) 4 MG disintegrating tablet  Every 8 hours PRN     Discontinue  Reprint     01/22/20 1209           Lorin Glass, [place] 01/22/20 1807    Maudie Flakes, MD 01/25/20 [phone]

## 2020-04-29 ENCOUNTER — Other Ambulatory Visit: Payer: Self-pay | Admitting: Obstetrics & Gynecology

## 2020-04-29 DIAGNOSIS — Z1231 Encounter for screening mammogram for malignant neoplasm of breast: Secondary | ICD-10-CM

## 2020-06-15 ENCOUNTER — Other Ambulatory Visit: Payer: Self-pay

## 2020-06-15 ENCOUNTER — Ambulatory Visit
Admission: RE | Admit: 2020-06-15 | Discharge: 2020-06-15 | Disposition: A | Discharge status: Home / Self Care | Payer: Managed Care, Other (non HMO) | Source: Ambulatory Visit | Attending: Obstetrics & Gynecology | Admitting: Obstetrics & Gynecology

## 2020-06-15 DIAGNOSIS — Z1231 Encounter for screening mammogram for malignant neoplasm of breast: Secondary | ICD-10-CM

## 2020-11-30 ENCOUNTER — Other Ambulatory Visit: Payer: Self-pay | Admitting: Family Medicine

## 2020-11-30 DIAGNOSIS — R110 Nausea: Secondary | ICD-10-CM

## 2020-12-01 ENCOUNTER — Ambulatory Visit
Admission: RE | Admit: 2020-12-01 | Discharge: 2020-12-01 | Disposition: A | Discharge status: Home / Self Care | Payer: Managed Care, Other (non HMO) | Source: Ambulatory Visit | Attending: Family Medicine | Admitting: Family Medicine

## 2020-12-01 DIAGNOSIS — R110 Nausea: Secondary | ICD-10-CM

## 2020-12-16 ENCOUNTER — Other Ambulatory Visit: Payer: Managed Care, Other (non HMO)

## 2021-07-23 ENCOUNTER — Emergency Department (HOSPITAL_BASED_OUTPATIENT_CLINIC_OR_DEPARTMENT_OTHER): Payer: Managed Care, Other (non HMO)

## 2021-07-23 ENCOUNTER — Other Ambulatory Visit: Payer: Self-pay

## 2021-07-23 ENCOUNTER — Encounter (HOSPITAL_BASED_OUTPATIENT_CLINIC_OR_DEPARTMENT_OTHER): Payer: Self-pay | Admitting: Emergency Medicine

## 2021-07-23 ENCOUNTER — Emergency Department (HOSPITAL_BASED_OUTPATIENT_CLINIC_OR_DEPARTMENT_OTHER)
Admission: EM | Admit: 2021-07-23 | Discharge: 2021-07-24 | Disposition: A | Discharge status: Home / Self Care | Payer: Managed Care, Other (non HMO) | Attending: Emergency Medicine | Admitting: Emergency Medicine

## 2021-07-23 DIAGNOSIS — W228XXA Striking against or struck by other objects, initial encounter: Secondary | ICD-10-CM | POA: Diagnosis not present

## 2021-07-23 DIAGNOSIS — S6992XA Unspecified injury of left wrist, hand and finger(s), initial encounter: Secondary | ICD-10-CM

## 2021-07-23 DIAGNOSIS — S60022A Contusion of left index finger without damage to nail, initial encounter: Secondary | ICD-10-CM | POA: Diagnosis not present

## 2021-07-23 NOTE — ED Triage Notes (Signed)
Pt presents to ED POV. Pt c/o injury to L pointer finger. Pt reports that she hit it on brick yesterday. C/o increased swelling today. R radial +2

## 2021-07-24 NOTE — ED Provider Notes (Signed)
[facility] Provider Note   CSN: [id number] Arrival date & time: 07/23/21  2258     History  Chief Complaint  Patient presents with   Finger Injury    Allison Webb is a 71 y.o. female.  71 year old female without significant past medical history who presents emerged from today with a bruised left pointer finger.  Patient states she was laying pavers the day prior and she hit it on 1.  She states that it hurt initially at her PIP but that pain is since subsided.  She states that she woke up today and she had bruising extending from her PIP distally and proximally.  This worried her as as not where the injury happened.  She still has no pain.  She has full range of motion of her fingers.  She has no injuries elsewhere.  She is on any blood thinners.       Home Medications Prior to Admission medications   Medication Sig Start Date End Date Taking? Authorizing Provider  benzonatate (TESSALON) 200 MG capsule TK 1 C PO TID FOR 10 DAYS 03/27/17   [provider]  Cholecalciferol (D3 VITAMIN PO) Take 1 tablet by mouth daily.    [provider]  HYDROcodone-acetaminophen (NORCO/VICODIN) 5-325 MG tablet Take 1 tablet by mouth every 6 (six) hours as needed for severe pain. 01/22/20   Lorin Glass, PA-C  loratadine (CLARITIN) 10 MG tablet Take by mouth.    [provider]  MEGARED OMEGA-3 KRILL OIL PO Take 1 tablet by mouth daily.    [provider]  mometasone (NASONEX) 50 MCG/ACT nasal spray Use 1 spray per nostril 1-2 times daily as needed 04/24/17   Bobbitt, Sedalia Muta, MD  ondansetron (ZOFRAN ODT) 4 MG disintegrating tablet Take 1 tablet (4 mg total) by mouth every 8 (eight) hours as needed for nausea or vomiting. 01/22/20   Lorin Glass, PA-C  pantoprazole (PROTONIX) 40 MG tablet Take 1 tablet (40 mg total) by mouth daily. Take 30-60 min before first meal of the day 06/08/14   Tanda Rockers, MD   triamcinolone (NASACORT) 55 MCG/ACT AERO nasal inhaler Place into the nose.    [provider]  vitamin C (ASCORBIC ACID) 500 MG tablet Take 500 mg by mouth.    [provider]      Allergies    Clonidine hcl, Codeine, Erythromycin, and Tramadol    Review of Systems   Review of Systems  Physical Exam Updated Vital Signs BP 124/75 (BP Location: Left Arm)    Pulse 70    Temp 98.2 F (36.8 C) (Oral)    Resp 18    Ht 5\' 7"  (1.702 m)    Wt 63.5 kg    SpO2 99%    BMI 21.93 kg/m  Physical Exam Vitals and nursing note reviewed.  Constitutional:      Appearance: She is well-developed.  HENT:     Head: Normocephalic and atraumatic.     Mouth/Throat:     Mouth: Mucous membranes are moist.     Pharynx: Oropharynx is clear.  Eyes:     Pupils: Pupils are equal, round, and reactive to light.  Cardiovascular:     Rate and Rhythm: Normal rate and regular rhythm.  Pulmonary:     Effort: Pulmonary effort is normal. No respiratory distress.     Breath sounds: No stridor.  Abdominal:     General: Abdomen is flat. There is no distension.  Musculoskeletal:        General: Swelling and signs of injury present. No tenderness (Tenderness noted of her left pointer finger or metacarpals on the same hand).     Cervical back: Normal range of motion.  Skin:    General: Skin is warm and dry.     Findings: Bruising (Over the distal portion of her left pointer finger on the dorsal surface and also proximally into her left hand past the MCP.) present.  Neurological:     General: No focal deficit present.     Mental Status: She is alert.     Comments: She can give me a thumbs up, A-OK sign, spread her fingers,    ED Results / Procedures / Treatments   Labs (all labs ordered are listed, but only abnormal results are displayed) Labs Reviewed - No data to display  EKG None  Radiology DG Finger Index Left  Result Date: 07/23/2021 CLINICAL DATA:  Status post trauma to the second  left finger. EXAM: LEFT INDEX FINGER 2+V COMPARISON:  None. FINDINGS: There is no evidence of fracture or dislocation. There is no evidence of arthropathy or other focal bone abnormality. Mild to moderate severity diffuse soft tissue swelling is seen. IMPRESSION: Diffuse soft tissue swelling without acute fracture. Electronically Signed   By: Virgina Norfolk M.D.   On: 07/23/2021 23:41    Procedures Procedures    Medications Ordered in ED Medications - No data to display  ED Course/ Medical Decision Making/ A&P                           Medical Decision Making  No obvious fracture on x-ray as reviewed by myself and radiology.  No evidence of compartment syndrome.  She seems to be neurologically intact.  She has good blood flow low suspicion for arterial damage.  I suspect this is just a normal distribution of bruising related to the trauma.   Final Clinical Impression(s) / ED Diagnoses Final diagnoses:  Injury of finger of left hand, initial encounter    Rx / DC Orders ED Discharge Orders     None         Meliza Kage, Corene Cornea, MD 07/24/21 0045

## 2021-07-24 NOTE — Discharge Instructions (Signed)
I do not see any evidence of fracture on your Xray. I do not see any evidence of infection, blood clot or other emergent condition of your finger. I suspect it is normal distribution of bruising after injury. Please return if symptoms worsen.

## 2021-08-17 ENCOUNTER — Other Ambulatory Visit: Payer: Self-pay | Admitting: Obstetrics & Gynecology

## 2021-08-17 DIAGNOSIS — Z1231 Encounter for screening mammogram for malignant neoplasm of breast: Secondary | ICD-10-CM

## 2021-08-18 ENCOUNTER — Ambulatory Visit
Admission: RE | Admit: 2021-08-18 | Discharge: 2021-08-18 | Disposition: A | Discharge status: Home / Self Care | Payer: Managed Care, Other (non HMO) | Source: Ambulatory Visit | Attending: Obstetrics & Gynecology | Admitting: Obstetrics & Gynecology

## 2021-08-18 DIAGNOSIS — Z1231 Encounter for screening mammogram for malignant neoplasm of breast: Secondary | ICD-10-CM

## 2021-11-21 ENCOUNTER — Other Ambulatory Visit: Payer: Self-pay | Admitting: Obstetrics & Gynecology

## 2021-11-21 DIAGNOSIS — M8580 Other specified disorders of bone density and structure, unspecified site: Secondary | ICD-10-CM

## 2021-11-23 ENCOUNTER — Encounter: Payer: Self-pay | Admitting: Podiatry

## 2021-11-23 ENCOUNTER — Ambulatory Visit: Payer: Managed Care, Other (non HMO) | Admitting: Podiatry

## 2021-11-23 ENCOUNTER — Ambulatory Visit (INDEPENDENT_AMBULATORY_CARE_PROVIDER_SITE_OTHER): Payer: Managed Care, Other (non HMO)

## 2021-11-23 DIAGNOSIS — M779 Enthesopathy, unspecified: Secondary | ICD-10-CM | POA: Diagnosis not present

## 2021-11-23 DIAGNOSIS — M21619 Bunion of unspecified foot: Secondary | ICD-10-CM | POA: Diagnosis not present

## 2021-11-23 DIAGNOSIS — D3613 Benign neoplasm of peripheral nerves and autonomic nervous system of lower limb, including hip: Secondary | ICD-10-CM

## 2021-11-23 DIAGNOSIS — D3610 Benign neoplasm of peripheral nerves and autonomic nervous system, unspecified: Secondary | ICD-10-CM

## 2021-11-24 NOTE — Progress Notes (Signed)
Subjective:   Patient ID: Allison Webb, female   DOB: 71 y.o.   MRN: [id number]   HPI Patient is developed a lot of burning in her forefoot left that is gotten worse over the last couple months.  States that it feels like shooting pains occur is worse with activities and she plays tennis and likes to hike.  Patient does not smoke   Review of Systems  All other systems reviewed and are negative.      Objective:  Physical Exam Vitals and nursing note reviewed.  Constitutional:      Appearance: She is well-developed.  Pulmonary:     Effort: Pulmonary effort is normal.  Musculoskeletal:        General: Normal range of motion.  Skin:    General: Skin is warm.  Neurological:     Mental Status: She is alert.    Neurovascular status intact muscle strength found to be adequate range of motion within normal limits.  Patient is noted to have shooting pains third interspace left foot with radiating discomfort and a positive Mulder sign mild discomfort around the metatarsal phalangeal joints nowhere near to the same degree.  Patient is found to have good digital perfusion well oriented x3     Assessment:  Probability for neuroma-like symptomatology left cannot rule out capsulitis or any other kind of bone cyst situation associated with this     Plan:  8 NP reviewed conditions and I do think we should treat his neuroma and see the results.  I did explain to the patient the causes patient had numerous questions that were answered today and I went ahead I did a sterile prep and injected the third interspace with a purified alcohol Marcaine solution 1.5 cc we will see the results of this and decide whether or not excision may be necessary or different treatment that might be necessary.  Hopefully have good response.  Answered numerous questions from patient  X-rays were negative for signs of fracture or bony injury associated with condition

## 2021-12-14 ENCOUNTER — Encounter: Payer: Self-pay | Admitting: Podiatry

## 2021-12-14 ENCOUNTER — Ambulatory Visit (INDEPENDENT_AMBULATORY_CARE_PROVIDER_SITE_OTHER): Payer: Managed Care, Other (non HMO) | Admitting: Podiatry

## 2021-12-14 DIAGNOSIS — D3610 Benign neoplasm of peripheral nerves and autonomic nervous system, unspecified: Secondary | ICD-10-CM | POA: Diagnosis not present

## 2021-12-14 NOTE — Progress Notes (Signed)
Subjective:   Patient ID: Allison Webb, female   DOB: 71 y.o.   MRN: [id number]   HPI Patient presents stating she had short-term relief when we did the injection last time but is now returned and certain shoes really bother her she wants to be active but this is preventing her from being active   ROS      Objective:  Physical Exam  Neurovascular status intact positive Biagio Borg sign with radiating shooting pain of the third interspace left foot and positive nodule within the intermetatarsal space when palpated.  Patient has good digital perfusion well oriented x3     Assessment:  Strong probability for neuroma symptomatology based on response to medication and positive Mulder sign     Plan:  H&P reviewed condition at great length discussed options.  She is looking for a definitive solution I have recommended neurectomy and I explained procedure risk associated with this.  Patient wants surgery I allowed her to read a consent form going over alternative treatments complications as listed and the fact there is no long-term guarantees and she is aware of this wants the procedure and signed consent form after extensive review.  Patient scheduled for outpatient surgery encouraged to call with any questions that may come up prior to procedure

## 2021-12-15 ENCOUNTER — Other Ambulatory Visit: Payer: Self-pay | Admitting: Obstetrics & Gynecology

## 2021-12-15 DIAGNOSIS — M8580 Other specified disorders of bone density and structure, unspecified site: Secondary | ICD-10-CM

## 2022-01-16 ENCOUNTER — Ambulatory Visit
Admission: RE | Admit: 2022-01-16 | Discharge: 2022-01-16 | Disposition: A | Discharge status: Home / Self Care | Payer: Managed Care, Other (non HMO) | Source: Ambulatory Visit | Attending: Obstetrics & Gynecology | Admitting: Obstetrics & Gynecology

## 2022-01-16 DIAGNOSIS — M8580 Other specified disorders of bone density and structure, unspecified site: Secondary | ICD-10-CM

## 2022-04-26 ENCOUNTER — Other Ambulatory Visit: Payer: Self-pay | Admitting: Family Medicine

## 2022-04-26 ENCOUNTER — Other Ambulatory Visit: Payer: Self-pay | Admitting: Surgery

## 2022-04-26 ENCOUNTER — Other Ambulatory Visit (HOSPITAL_COMMUNITY): Payer: Self-pay | Admitting: Family Medicine

## 2022-04-26 DIAGNOSIS — E782 Mixed hyperlipidemia: Secondary | ICD-10-CM

## 2022-05-01 ENCOUNTER — Ambulatory Visit (HOSPITAL_COMMUNITY)
Admission: RE | Admit: 2022-05-01 | Discharge: 2022-05-01 | Disposition: A | Discharge status: Home / Self Care | Payer: Managed Care, Other (non HMO) | Source: Ambulatory Visit | Attending: Family Medicine | Admitting: Family Medicine

## 2022-05-01 DIAGNOSIS — E782 Mixed hyperlipidemia: Secondary | ICD-10-CM | POA: Insufficient documentation

## 2022-09-03 ENCOUNTER — Other Ambulatory Visit: Payer: Self-pay | Admitting: Obstetrics & Gynecology

## 2022-09-03 ENCOUNTER — Ambulatory Visit
Admission: RE | Admit: 2022-09-03 | Discharge: 2022-09-03 | Disposition: A | Discharge status: Home / Self Care | Payer: Medicare Other | Source: Ambulatory Visit | Attending: Obstetrics & Gynecology | Admitting: Obstetrics & Gynecology

## 2022-09-03 DIAGNOSIS — Z1231 Encounter for screening mammogram for malignant neoplasm of breast: Secondary | ICD-10-CM

## 2023-03-22 NOTE — Therapy (Unsigned)
OUTPATIENT PHYSICAL THERAPY FEMALE PELVIC EVALUATION   Patient Name: RAENETTE MUSCH MRN: [id number] DOB:[birth year], 72 y.o., female Today's Date: 03/25/2023  END OF SESSION:  PT End of Session - 03/25/23 1400     Visit Number 1    Date for PT Re-Evaluation 05/20/23    Authorization Type Medicare    Authorization - Visit Number 1    Authorization - Number of Visits 10    PT Start Time 1400    PT Stop Time 1440    PT Time Calculation (min) 40 min    Activity Tolerance Patient tolerated treatment well    Behavior During Therapy WFL for tasks assessed/performed             Past Medical History:  Diagnosis Date   Asthma    Pt states she does not have asathma   Eczema    Endometriosis    Hyperlipidemia    Skin cancer    Past Surgical History:  Procedure Laterality Date   ANKLE SURGERY     right ankle   APPENDECTOMY     KNEE SURGERY     right knee   LAPAROTOMY     Patient Active Problem List   Diagnosis Date Noted   History of food allergy 04/24/2017   Allergic reaction 04/24/2017   Cough, persistent 05/17/2014   Chest pain 01/18/2011   Hyperlipidemia 01/18/2011   Chronic rhinitis 09/09/2007   ACUTE BRONCHOSPASM 09/09/2007   ENDOMETRIOSIS 09/09/2007   SKIN CANCER, HX OF 09/09/2007    PCP: Daisy Floro, MD  REFERRING PROVIDER: Daisy Floro, MD  REFERRING DIAG: M25.551 Right Hip Pain :M62.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  Fluid intake: Yes: coffee, water    PAIN:  Are you having pain? Yes NPRS scale: 7/10 Pain  location:  right hip  Pain type: shooting Pain description: intermittent   Aggravating factors: sleeping and roll over, sit with right leg over left Relieving factors: not rolling over, sitting push the right hip down  PRECAUTIONS: Other: skin cancer  RED FLAGS: None   WEIGHT BEARING RESTRICTIONS: No  FALLS:  Has patient fallen in last 6 months? No  LIVING ENVIRONMENT: Lives with: lives  with their spouse  OCCUPATION: tennis, gym 3 times per week, walk  PLOF: Independent  PATIENT GOALS: reduce right hip pain  PERTINENT HISTORY:  Appendectomy; Endometriosis; Skin Caner, osteopenia   BOWEL MOVEMENT:  Pain with bowel movement: No Type of bowel movement:Strain Yes   URINATION: Pain with urination: No Fully empty bladder: No, sometimes has to stand up and sit down Stream: Strong Urgency: Yes: sometimes Frequency: depending on what she drinks Leakage:  when she is physically tired or cough    PREGNANCY: Vaginal deliveries 1 Tearing Yes: some tearing  OBJECTIVE:   DIAGNOSTIC FINDINGS:  none   COGNITION: Overall cognitive status: Within functional limits for tasks assessed     SENSATION: Light touch: Appears intact Proprioception: Appears intact    POSTURE: rounded shoulders, forward head, and decreased lumbar lordosis  PELVIC ALIGNMENT:  LUMBARAROM/PROM:  A/PROM A/PROM  eval  Extension Decreased by 50%  Right rotation Decreased by 25%   (Blank rows = not tested)  LOWER EXTREMITY ROM: Bilateral hip ROM is full   LOWER EXTREMITY MMT:  MMT Right eval Left eval  Hip extension 4/5 4/5  Hip abduction 3/5 with pain 4/5   PALPATION:   General  tenderness located in the right psoas, lower abdomen, right hip adductor, tenderness located in the posterior right hip, lateral to right ischial tuberosity               Patient confirms identification and approves PT to assess internal pelvic floor and treatment No  PELVIC MMT:   MMT eval   Vaginal   Internal Anal Sphincter   External Anal Sphincter   Puborectalis   Diastasis Recti   (Blank rows = not tested)         TODAY'S TREATMENT:                                                                                                                              DATE: 03/25/23  EVAL  Trigger Point Dry-Needling  Treatment instructions: Expect mild to moderate muscle soreness. S/S of pneumothorax if dry needled over a lung field, and to seek immediate medical attention should they occur. Patient verbalized understanding of these instructions and education.  Patient Consent Given: Yes Education handout provided: Yes Muscles treated: right hamstring, right hip adductor, right hip inferior gemllis Electrical stimulation performed: No Parameters: N/A Treatment response/outcome: elongation of muscle and trigger point response Quadruped with hip internally rotated and rock back and forth Sit on a tennis ball and massage muscles    PATIENT EDUCATION:  03/25/23 Education details: information on dry needling, hip stretch Person educated: Patient Education method: Explanation, Demonstration, Tactile cues, Verbal cues, and Handouts Education comprehension: verbalized understanding, returned demonstration, verbal cues required, tactile cues required, and needs further education  HOME EXERCISE PROGRAM: See above.   ASSESSMENT:  CLINICAL IMPRESSION: Patient is a 72 y.o. female who was seen today for physical therapy evaluation and treatment for right hip pain.  Patient has had right hip pain for 5 years. She reports her pain is intermittent at level  7/10 with crossing right leg over left and rolling in bed. She has pinpoint tenderness located in the right lateral ischial tuberosity, right hip external rotator attached to the right ischial tuberosity. She has pain and weakness in the right hip abduction. She has tightness in the right SI joint. Patient will benefit from skilled therapy  to reduce her pain and improve her strength.   OBJECTIVE IMPAIRMENTS: decreased strength, increased fascial restrictions, increased muscle spasms, and pain.   ACTIVITY LIMITATIONS: sitting and bed mobility  PARTICIPATION LIMITATIONS: community activity  PERSONAL FACTORS: Time since onset of injury/illness/exacerbation are also affecting patient's functional outcome.   REHAB POTENTIAL: Excellent  CLINICAL DECISION MAKING: Stable/uncomplicated  EVALUATION COMPLEXITY: Low   GOALS: Goals reviewed with patient? Yes  SHORT TERM GOALS: Target date: 04/17/23  Patient independent with initial HEP for stretches and right hip strength.  Baseline: Goal status: INITIAL  2.  Patient is able to rolling in bed with pain decreased >/= 25% due to decreased trigger points.  Baseline:  Goal status: INITIAL  3.  Patient is able to sit with right leg crossed over left with pain level decreased >/= 25%. Baseline:  Goal status: INITIAL    LONG TERM GOALS: Target date: 05/20/23  Patient independent with advanced HEP for right hip strength.  Baseline:  Goal status: INITIAL  2.  Patient is able to roll in bed with pain level decreased </= 1-2/10.  Baseline:  Goal status: INITIAL  3.  Patient is able to sit with right leg over left with pain level </= 1-2/10 Baseline:  Goal status: INITIAL  4.  Right hip strength is >/= 4/5 without pain to stabilize during her tennis and exercise at the gym.  Baseline:  Goal status: INITIAL   PLAN:  PT FREQUENCY: 1x/week  PT DURATION: 8 weeks  PLANNED INTERVENTIONS: Therapeutic exercises, Therapeutic activity, Neuromuscular re-education, Patient/Family education, Joint mobilization, Dry Needling, Electrical stimulation, Cryotherapy, Moist heat, Taping, Ionotophoresis 4mg /ml Dexamethasone, and Manual therapy  PLAN FOR NEXT SESSION: see how dry needling did, manual work to right hip and psoas, mobilize the right SI joint; right hip abduction  strength   Eulis Foster, PT 03/25/23 3:12 PM

## 2023-03-25 ENCOUNTER — Ambulatory Visit: Payer: Medicare Other | Attending: Family Medicine | Admitting: Physical Therapy

## 2023-03-25 ENCOUNTER — Other Ambulatory Visit: Payer: Self-pay

## 2023-03-25 ENCOUNTER — Encounter: Payer: Self-pay | Admitting: Physical Therapy

## 2023-03-25 DIAGNOSIS — M6289 Other specified disorders of muscle: Secondary | ICD-10-CM | POA: Insufficient documentation

## 2023-03-25 DIAGNOSIS — M25551 Pain in right hip: Secondary | ICD-10-CM | POA: Insufficient documentation

## 2023-03-25 DIAGNOSIS — M6281 Muscle weakness (generalized): Secondary | ICD-10-CM | POA: Diagnosis not present

## 2023-03-25 NOTE — Patient Instructions (Signed)

## 2023-04-01 ENCOUNTER — Encounter: Payer: Self-pay | Admitting: Physical Therapy

## 2023-04-01 ENCOUNTER — Ambulatory Visit: Payer: Medicare Other | Admitting: Physical Therapy

## 2023-04-01 DIAGNOSIS — M6281 Muscle weakness (generalized): Secondary | ICD-10-CM

## 2023-04-01 DIAGNOSIS — M25551 Pain in right hip: Secondary | ICD-10-CM | POA: Diagnosis not present

## 2023-04-01 NOTE — Therapy (Signed)
OUTPATIENT PHYSICAL THERAPY FEMALE PELVIC TREATMENT   Patient Name: Allison Webb MRN: [id number] DOB:08-Jul-1951, 72 y.o., female Today's Date: 04/01/2023  END OF SESSION:  PT End of Session - 04/01/23 0846     Visit Number 2    Date for PT Re-Evaluation 05/20/23    Authorization Type Medicare    Authorization - Visit Number 2    Authorization - Number of Visits 10    PT Start Time 0845    PT Stop Time 0925    PT Time Calculation (min) 40 min    Activity Tolerance Patient tolerated treatment well    Behavior During Therapy [facility] for tasks assessed/performed             Past Medical History:  Diagnosis Date   Asthma    Pt states she does not have asathma   Eczema    Endometriosis    Hyperlipidemia    Skin cancer    Past Surgical History:  Procedure Laterality Date   ANKLE SURGERY     right ankle   APPENDECTOMY     KNEE SURGERY     right knee   LAPAROTOMY     Patient Active Problem List   Diagnosis Date Noted   History of food allergy 04/24/2017   Allergic reaction 04/24/2017   Cough, persistent 05/17/2014   Chest pain 01/18/2011   Hyperlipidemia 01/18/2011   Chronic rhinitis 09/09/2007   ACUTE BRONCHOSPASM 09/09/2007   ENDOMETRIOSIS 09/09/2007   SKIN CANCER, HX OF 09/09/2007    PCP: Daisy Floro, MD  REFERRING PROVIDER: Daisy Floro, MD  REFERRING DIAG: M25.551 Right Hip Pain :M62.89 Pelvic Floor Instability  THERAPY DIAG:  Muscle weakness (generalized)  Pain in right hip  Rationale for Evaluation and Treatment: Rehabilitation  ONSET DATE: 2019  SUBJECTIVE:                                                                                                                                                                                           SUBJECTIVE STATEMENT: The dry needling helped a lot.   Fluid intake: Yes: coffee, water    PAIN:  Are you having pain? Yes NPRS scale: 7/10 Pain location:  right hip  Pain type:  shooting Pain description: intermittent   Aggravating factors: sleeping and roll over, sit with right leg over left Relieving factors: not rolling over, sitting push the right hip down  PRECAUTIONS: Other: skin cancer  RED FLAGS: None   WEIGHT BEARING RESTRICTIONS: No  FALLS:  Has patient fallen in last 6 months? No  LIVING ENVIRONMENT: Lives with: lives with their spouse  OCCUPATION: tennis,  gym 3 times per week, walk  PLOF: Independent  PATIENT GOALS: reduce right hip pain  PERTINENT HISTORY:  Appendectomy; Endometriosis; Skin Caner, osteopenia   BOWEL MOVEMENT:  Pain with bowel movement: No Type of bowel movement:Strain Yes   URINATION: Pain with urination: No Fully empty bladder: No, sometimes has to stand up and sit down Stream: Strong Urgency: Yes: sometimes Frequency: depending on what she drinks Leakage:  when she is physically tired or cough    PREGNANCY: Vaginal deliveries 1 Tearing Yes: some tearing  OBJECTIVE:   DIAGNOSTIC FINDINGS:  none   COGNITION: Overall cognitive status: Within functional limits for tasks assessed     SENSATION: Light touch: Appears intact Proprioception: Appears intact    POSTURE: rounded shoulders, forward head, and decreased lumbar lordosis  PELVIC ALIGNMENT:  LUMBARAROM/PROM:  A/PROM A/PROM  eval  Extension Decreased by 50%  Right rotation Decreased by 25%   (Blank rows = not tested)  LOWER EXTREMITY ROM: Bilateral hip ROM is full   LOWER EXTREMITY MMT:  MMT Right eval Left eval  Hip extension 4/5 4/5  Hip abduction 3/5 with pain 4/5   PALPATION:   General  tenderness located in the right psoas, lower abdomen, right hip adductor, tenderness located in the posterior right hip, lateral to right ischial tuberosity               Patient confirms identification and approves PT to assess internal pelvic floor and treatment No  PELVIC MMT:   MMT eval  Vaginal   Internal Anal Sphincter    External Anal Sphincter   Puborectalis   Diastasis Recti   (Blank rows = not tested)         TODAY'S TREATMENT:    04/01/23 Manual: Soft tissue mobilization:  To assess for dry needling Manual work to the right hamstring, gemelli, piriformis, and gluteals Spinal mobilization: Right SI joint mobilization in prone Trigger Point Dry-Needling  Treatment instructions: Expect mild to moderate muscle soreness. S/S of pneumothorax if dry needled over a lung field, and to seek immediate medical attention should they occur. Patient verbalized understanding of these instructions and education.  Patient Consent Given: Yes Education handout provided: Yes Muscles treated: right hamstring,  right hip inferior gemelli, right piriformis Electrical stimulation performed: No Parameters: N/A Treatment response/outcome: elongation of muscle and trigger point response Exercises: Stretches/mobility: Quadruped rock with hip internal rotation Standing hip rock with hip internal rotation Sitting with yoga block between knees and moving knee forward and back Sitting piriformis stretch holding 30 sec bil.  Sitting hip internal rotation 10 x                                                                                                                               DATE: 03/25/23  EVAL  Trigger Point Dry-Needling  Treatment instructions: Expect mild to moderate muscle soreness. S/S of pneumothorax if dry needled over a lung field, and to seek immediate  medical attention should they occur. Patient verbalized understanding of these instructions and education.  Patient Consent Given: Yes Education handout provided: Yes Muscles treated: right hamstring, right hip adductor, right hip inferior gemllis Electrical stimulation performed: No Parameters: N/A Treatment response/outcome: elongation of muscle and trigger point response Quadruped with hip internally rotated and rock back and forth Sit on a tennis  ball and massage muscles    PATIENT EDUCATION: 04/01/23 Education details: Access Code: [email or web address] Person educated: Patient Education method: Explanation, Demonstration, Tactile cues, Verbal cues, and Handouts Education comprehension: verbalized understanding, returned demonstration, verbal cues required, tactile cues required, and needs further education   HOME EXERCISE PROGRAM: 04/01/23 Access Code: [email or web address] URL: [email or web address] Date: 04/01/2023 Prepared by: Eulis Foster  Program Notes sitting with a roll of paper towel between knees and bring one knee forward than the other. 10 x   Exercises - Quadruped Rocking Slow  - 1 x daily - 7 x weekly - 2 sets - 5 reps - Seated Hip Internal Rotation AROM  - 1 x daily - 7 x weekly - 1 sets - 10 reps - Seated Piriformis Stretch with Trunk Bend  - 1 x daily - 7 x weekly - 1 sets - 2 reps - 30 sec hold ASSESSMENT:  CLINICAL IMPRESSION: Patient is a 72 y.o. female who was seen today for physical therapy  treatment for right hip pain.  Patient is having less pain in the right gluteal area. She has improved range of the right SI joint. She is able to do her stretches.  Patient will benefit from skilled therapy to reduce her pain and improve her strength.   OBJECTIVE IMPAIRMENTS: decreased strength, increased fascial restrictions, increased muscle spasms, and pain.   ACTIVITY LIMITATIONS: sitting and bed mobility  PARTICIPATION LIMITATIONS: community activity  PERSONAL FACTORS: Time since onset of injury/illness/exacerbation are also affecting patient's functional outcome.   REHAB POTENTIAL: Excellent  CLINICAL DECISION MAKING: Stable/uncomplicated  EVALUATION COMPLEXITY: Low   GOALS: Goals reviewed with patient? Yes  SHORT TERM GOALS: Target date: 04/17/23  Patient independent with initial HEP for stretches and right hip strength.  Baseline: Goal status: Met 04/01/23  2.  Patient is able to rolling in bed with  pain decreased >/= 25% due to decreased trigger points.  Baseline:  Goal status: INITIAL  3.  Patient is able to sit with right leg crossed over left with pain level decreased >/= 25%. Baseline:  Goal status: INITIAL    LONG TERM GOALS: Target date: 05/20/23  Patient independent with advanced HEP for right hip strength.  Baseline:  Goal status: INITIAL  2.  Patient is able to roll in bed with pain level decreased </= 1-2/10.  Baseline:  Goal status: INITIAL  3.  Patient is able to sit with right leg over left with pain level </= 1-2/10 Baseline:  Goal status: INITIAL  4.  Right hip strength is >/= 4/5 without pain to stabilize during her tennis and exercise at the gym.  Baseline:  Goal status: INITIAL   PLAN:  PT FREQUENCY: 1x/week  PT DURATION: 8 weeks  PLANNED INTERVENTIONS: Therapeutic exercises, Therapeutic activity, Neuromuscular re-education, Patient/Family education, Joint mobilization, Dry Needling, Electrical stimulation, Cryotherapy, Moist heat, Taping, Ionotophoresis 4mg /ml Dexamethasone, and Manual therapy  PLAN FOR NEXT SESSION:  manual work to right  psoas, mobilize the right SI joint; right hip abduction strength   Eulis Foster, PT 04/01/23 9:31 AM

## 2023-04-08 ENCOUNTER — Encounter: Payer: Self-pay | Admitting: Physical Therapy

## 2023-04-08 ENCOUNTER — Ambulatory Visit: Payer: Medicare Other | Admitting: Physical Therapy

## 2023-04-08 DIAGNOSIS — M25551 Pain in right hip: Secondary | ICD-10-CM

## 2023-04-08 DIAGNOSIS — M6281 Muscle weakness (generalized): Secondary | ICD-10-CM

## 2023-04-08 NOTE — Therapy (Signed)
OUTPATIENT PHYSICAL THERAPY FEMALE PELVIC TREATMENT   Patient Name: Allison Webb MRN: [id number] DOB:1951/04/12, 72 y.o., female Today's Date: 04/08/2023  END OF SESSION:  PT End of Session - 04/08/23 0843     Visit Number 3    Date for PT Re-Evaluation 05/20/23    Authorization Type Medicare    Authorization - Visit Number 3    Authorization - Number of Visits 10    PT Start Time 0845    PT Stop Time 0925    PT Time Calculation (min) 40 min    Activity Tolerance Patient tolerated treatment well    Behavior During Therapy [facility] for tasks assessed/performed             Past Medical History:  Diagnosis Date   Asthma    Pt states she does not have asathma   Eczema    Endometriosis    Hyperlipidemia    Skin cancer    Past Surgical History:  Procedure Laterality Date   ANKLE SURGERY     right ankle   APPENDECTOMY     KNEE SURGERY     right knee   LAPAROTOMY     Patient Active Problem List   Diagnosis Date Noted   History of food allergy 04/24/2017   Allergic reaction 04/24/2017   Cough, persistent 05/17/2014   Chest pain 01/18/2011   Hyperlipidemia 01/18/2011   Chronic rhinitis 09/09/2007   ACUTE BRONCHOSPASM 09/09/2007   ENDOMETRIOSIS 09/09/2007   SKIN CANCER, HX OF 09/09/2007    PCP: Daisy Floro, MD  REFERRING PROVIDER: Daisy Floro, MD  REFERRING DIAG: M25.551 Right Hip Pain :M62.89 Pelvic Floor Instability  THERAPY DIAG:  Muscle weakness (generalized)  Pain in right hip  Rationale for Evaluation and Treatment: Rehabilitation  ONSET DATE: 2019  SUBJECTIVE:                                                                                                                                                                                           SUBJECTIVE STATEMENT: I can not move my right leg like the left.   Fluid intake: Yes: coffee, water    PAIN:  Are you having pain? Yes NPRS scale: 5/10 Pain location:  right hip  Pain  type: shooting Pain description: intermittent   Aggravating factors: sleeping and roll over, sit with right leg over left Relieving factors: not rolling over, sitting push the right hip down  PRECAUTIONS: Other: skin cancer  RED FLAGS: None   WEIGHT BEARING RESTRICTIONS: No  FALLS:  Has patient fallen in last 6 months? No  LIVING ENVIRONMENT: Lives with: lives with their  spouse  OCCUPATION: tennis, gym 3 times per week, walk  PLOF: Independent  PATIENT GOALS: reduce right hip pain  PERTINENT HISTORY:  Appendectomy; Endometriosis; Skin Caner, osteopenia   BOWEL MOVEMENT:  Pain with bowel movement: No Type of bowel movement:Strain Yes   URINATION: Pain with urination: No Fully empty bladder: No, sometimes has to stand up and sit down Stream: Strong Urgency: Yes: sometimes Frequency: depending on what she drinks Leakage:  when she is physically tired or cough    PREGNANCY: Vaginal deliveries 1 Tearing Yes: some tearing  OBJECTIVE:   DIAGNOSTIC FINDINGS:  none   COGNITION: Overall cognitive status: Within functional limits for tasks assessed     SENSATION: Light touch: Appears intact Proprioception: Appears intact    POSTURE: rounded shoulders, forward head, and decreased lumbar lordosis  PELVIC ALIGNMENT:  LUMBARAROM/PROM:  A/PROM A/PROM  eval  Extension Decreased by 50%  Right rotation Decreased by 25%   (Blank rows = not tested)  LOWER EXTREMITY ROM: Bilateral hip ROM is full   LOWER EXTREMITY MMT:  MMT Right eval Left eval  Hip extension 4/5 4/5  Hip abduction 3/5 with pain 4/5   PALPATION:   General  tenderness located in the right psoas, lower abdomen, right hip adductor, tenderness located in the posterior right hip, lateral to right ischial tuberosity               Patient confirms identification and approves PT to assess internal pelvic floor and treatment No  PELVIC MMT:   MMT 04/08/23  Vaginal 4/5  Internal Anal  Sphincter   External Anal Sphincter   Puborectalis   Diastasis Recti   (Blank rows = not tested)         TODAY'S TREATMENT:    04/08/23 Manual: Soft tissue mobilization: Right calf to improve tissue mobility Internal pelvic floor techniques: No emotional/communication barriers or cognitive limitation. Patient is motivated to learn. Patient understands and agrees with treatment goals and plan. PT explains patient will be examined in standing, sitting, and lying down to see how their muscles and joints work. When they are ready, they will be asked to remove their underwear so PT can examine their perineum. The patient is also given the option of providing their own chaperone as one is not provided in our facility. The patient also has the right and is explained the right to defer or refuse any part of the evaluation or treatment including the internal exam. With the patient's consent, PT will use one gloved finger to gently assess the muscles of the pelvic floor, seeing how well it contracts and relaxes and if there is muscle symmetry. After, the patient will get dressed and PT and patient will discuss exam findings and plan of care. PT and patient discuss plan of care, schedule, attendance policy and HEP activities.  Going through the vaginal canal working on the right obturator internist, iliococcygeus with right hip movement Educated patient on how to perform manual work to the right pelvic floor at home and she returned demonstration.  Exercises: Stretches/mobility: Sitting with ball between knees and move thigh back and forth Quadruped with right knee on yoga block moving the left hip up and down to mobilize right SI joint Educated patient on vaginal moisturizers and how to massage the cream into the vaginal canal     04/01/23 Manual: Soft tissue mobilization:  To assess for dry needling Manual work to the right hamstring, gemelli, piriformis, and gluteals Spinal mobilization: Right SI  joint  mobilization in prone Trigger Point Dry-Needling  Treatment instructions: Expect mild to moderate muscle soreness. S/S of pneumothorax if dry needled over a lung field, and to seek immediate medical attention should they occur. Patient verbalized understanding of these instructions and education.  Patient Consent Given: Yes Education handout provided: Yes Muscles treated: right hamstring,  right hip inferior gemelli, right piriformis Electrical stimulation performed: No Parameters: N/A Treatment response/outcome: elongation of muscle and trigger point response Exercises: Stretches/mobility: Quadruped rock with hip internal rotation Standing hip rock with hip internal rotation Sitting with yoga block between knees and moving knee forward and back Sitting piriformis stretch holding 30 sec bil.  Sitting hip internal rotation 10 x                                                                                                                               DATE: 03/25/23  EVAL  Trigger Point Dry-Needling  Treatment instructions: Expect mild to moderate muscle soreness. S/S of pneumothorax if dry needled over a lung field, and to seek immediate medical attention should they occur. Patient verbalized understanding of these instructions and education.  Patient Consent Given: Yes Education handout provided: Yes Muscles treated: right hamstring, right hip adductor, right hip inferior gemllis Electrical stimulation performed: No Parameters: N/A Treatment response/outcome: elongation of muscle and trigger point response Quadruped with hip internally rotated and rock back and forth Sit on a tennis ball and massage muscles    PATIENT EDUCATION: 04/08/23 Education details: Access Code: [email or web address], educated patient on vaginal moisturizers, educated patient on how to massage the right pelvic floor and gave her vaginal lubricant samples to use with the manual work Person educated:  Patient Education method: Programmer, multimedia, Facilities manager, Actor cues, Verbal cues, and Handouts Education comprehension: verbalized understanding, returned demonstration, verbal cues required, tactile cues required, and needs further education   HOME EXERCISE PROGRAM: 04/01/23 Access Code: [email or web address] URL: [email or web address] Date: 04/01/2023 Prepared by: Eulis Foster  Program Notes sitting with a roll of paper towel between knees and bring one knee forward than the other. 10 x   Exercises - Quadruped Rocking Slow  - 1 x daily - 7 x weekly - 2 sets - 5 reps - Seated Hip Internal Rotation AROM  - 1 x daily - 7 x weekly - 1 sets - 10 reps - Seated Piriformis Stretch with Trunk Bend  - 1 x daily - 7 x weekly - 1 sets - 2 reps - 30 sec hold ASSESSMENT:  CLINICAL IMPRESSION: Patient is a 72 y.o. female who was seen today for physical therapy  treatment for right hip pain. Pain level decreased from 7/10 to 5/10.  Pelvic floor strength is 4/5. She has trigger points in the right obturator internist and right iliococcygeus. Patient understands how to perform manual work to the right pelvic floor to continue lengthening the muscles. Patient will benefit from skilled therapy to reduce her pain and improve her strength.   OBJECTIVE IMPAIRMENTS:  decreased strength, increased fascial restrictions, increased muscle spasms, and pain.   ACTIVITY LIMITATIONS: sitting and bed mobility  PARTICIPATION LIMITATIONS: community activity  PERSONAL FACTORS: Time since onset of injury/illness/exacerbation are also affecting patient's functional outcome.   REHAB POTENTIAL: Excellent  CLINICAL DECISION MAKING: Stable/uncomplicated  EVALUATION COMPLEXITY: Low   GOALS: Goals reviewed with patient? Yes  SHORT TERM GOALS: Target date: 04/17/23  Patient independent with initial HEP for stretches and right hip strength.  Baseline: Goal status: Met 04/01/23  2.  Patient is able to rolling in bed  with pain decreased >/= 25% due to decreased trigger points.  Baseline:  Goal status: Met 04/08/23  3.  Patient is able to sit with right leg crossed over left with pain level decreased >/= 25%. Baseline:  Goal status: met 04/08/23    LONG TERM GOALS: Target date: 05/20/23  Patient independent with advanced HEP for right hip strength.  Baseline:  Goal status: INITIAL  2.  Patient is able to roll in bed with pain level decreased </= 1-2/10.  Baseline:  Goal status: INITIAL  3.  Patient is able to sit with right leg over left with pain level </= 1-2/10 Baseline:  Goal status: INITIAL  4.  Right hip strength is >/= 4/5 without pain to stabilize during her tennis and exercise at the gym.  Baseline:  Goal status: INITIAL   PLAN:  PT FREQUENCY: 1x/week  PT DURATION: 8 weeks  PLANNED INTERVENTIONS: Therapeutic exercises, Therapeutic activity, Neuromuscular re-education, Patient/Family education, Joint mobilization, Dry Needling, Electrical stimulation, Cryotherapy, Moist heat, Taping, Ionotophoresis 4mg /ml Dexamethasone, and Manual therapy  PLAN FOR NEXT SESSION:  manual work to right  psoas, see how the right pelvic floor is doing; right hip abduction strength   Eulis Foster, PT 04/08/23 9:31 AM

## 2023-04-08 NOTE — Patient Instructions (Signed)
Moisturizers They are used in the vagina to hydrate the mucous membrane that make up the vaginal canal. Designed to keep a more normal acid balance (ph) Once placed in the vagina, it will last between two to three days.  Use 2-3 times per week at bedtime  Ingredients to avoid is glycerin and fragrance, can increase chance of infection Should not be used just before sex due to causing irritation Most are gels administered either in a tampon-shaped applicator or as a vaginal suppository. They are non-hormonal.   Types of Moisturizers(internal use)  Vitamin E vaginal suppositories- Whole foods, Amazon Moist Again Coconut oil- can break down condoms, any grocery store (prefer organic) Julva- (Do no use if taking  Tamoxifen) amazon Yes moisturizer- amazon NeuEve Silk , NeuEve Silver for menopausal or over 65 (if have severe vaginal atrophy or cancer treatments use NeuEve Silk for  1 month than move to [employer])- [employer], [place].com Olive and Bee intimate cream- www.oliveandbee.com.au Mae vaginal moisturizer- Amazon Aloe Good Clean Love Hyaluronic acid Hyalofemme Reveree hyaluronic acid inserts   Creams to use externally on the Vulva area Marathon Oil (good for for cancer patients that had radiation to the area)- [place] or [employer].https://garcia-[email or web address]/ Vulva Balm/ V-magic cream by medicine mama- amazon Julva-amazon Vital "V Wild Yam salve ( help moisturize and help with thinning vulvar area, does have Beeswax MoodMaid Botanical Pro-Meno Wild Yam Cream- Amazon Desert Harvest Gele Cleo by Zane Herald labial moisturizer (Amazon),  Coconut or olive oil aloe Good Clean Love Enchanted Rose by intimate rose  Things to avoid in the vaginal area Do not use things to irritate the vulvar area No lotions just specialized creams for the vulva area- Neogyn, V-magic,  No soaps; can use Aveeno or Calendula cleanser, unscented Dove if needed. Must be gentle No deodorants No douches Good to  sleep without underwear to let the vaginal area to air out No scrubbing: spread the lips to let warm water rinse over labias and pat dry   [facility] [address], [address] [place], [place] [address] Phone # [phone] Fax [phone]

## 2023-04-17 ENCOUNTER — Ambulatory Visit: Payer: Medicare Other | Attending: Family Medicine | Admitting: Physical Therapy

## 2023-04-17 ENCOUNTER — Encounter: Payer: Self-pay | Admitting: Physical Therapy

## 2023-04-17 DIAGNOSIS — M25551 Pain in right hip: Secondary | ICD-10-CM | POA: Diagnosis present

## 2023-04-17 DIAGNOSIS — M6281 Muscle weakness (generalized): Secondary | ICD-10-CM | POA: Insufficient documentation

## 2023-04-17 NOTE — Therapy (Signed)
OUTPATIENT PHYSICAL THERAPY FEMALE PELVIC TREATMENT   Patient Name: Allison Webb MRN: [id number] DOB:12-09-1950, 72 y.o., female Today's Date: 04/17/2023  END OF SESSION:  PT End of Session - 04/17/23 1231     Visit Number 4    Date for PT Re-Evaluation 05/20/23    Authorization Type Medicare    Authorization - Visit Number 4    Authorization - Number of Visits 10    PT Start Time 1230    PT Stop Time 1310    PT Time Calculation (min) 40 min    Activity Tolerance Patient tolerated treatment well    Behavior During Therapy WFL for tasks assessed/performed             Past Medical History:  Diagnosis Date   Asthma    Pt states she does not have asathma   Eczema    Endometriosis    Hyperlipidemia    Skin cancer    Past Surgical History:  Procedure Laterality Date   ANKLE SURGERY     right ankle   APPENDECTOMY     KNEE SURGERY     right knee   LAPAROTOMY     Patient Active Problem List   Diagnosis Date Noted   History of food allergy 04/24/2017   Allergic reaction 04/24/2017   Cough, persistent 05/17/2014   Chest pain 01/18/2011   Hyperlipidemia 01/18/2011   Chronic rhinitis 09/09/2007   ACUTE BRONCHOSPASM 09/09/2007   Endometriosis 09/09/2007   SKIN CANCER, HX OF 09/09/2007    PCP: Daisy Floro, MD  REFERRING PROVIDER: Daisy Floro, MD  REFERRING DIAG: M25.551 Right Hip Pain :M62.89 Pelvic Floor Instability  THERAPY DIAG:  Muscle weakness (generalized)  Pain in right hip  Rationale for Evaluation and Treatment: Rehabilitation  ONSET DATE: 2019  SUBJECTIVE:                                                                                                                                                                                           SUBJECTIVE STATEMENT: My hip is giving me trouble and feels like it catches. I have tried to do the internal work but not sure that I am doing it right.   Fluid intake: Yes: coffee, water     PAIN:  Are you having pain? Yes NPRS scale: 5/10 Pain location:  right hip  Pain type: shooting Pain description: intermittent   Aggravating factors: sleeping and roll over, sit with right leg over left Relieving factors: not rolling over, sitting push the right hip down  PRECAUTIONS: Other: skin cancer  RED FLAGS: None   WEIGHT BEARING RESTRICTIONS: No  FALLS:  Has patient fallen in last 6 months? No  LIVING ENVIRONMENT: Lives with: lives with their spouse  OCCUPATION: tennis, gym 3 times per week, walk  PLOF: Independent  PATIENT GOALS: reduce right hip pain  PERTINENT HISTORY:  Appendectomy; Endometriosis; Skin Caner, osteopenia   BOWEL MOVEMENT:  Pain with bowel movement: No Type of bowel movement:Strain Yes   URINATION: Pain with urination: No Fully empty bladder: No, sometimes has to stand up and sit down Stream: Strong Urgency: Yes: sometimes Frequency: depending on what she drinks Leakage:  when she is physically tired or cough    PREGNANCY: Vaginal deliveries 1 Tearing Yes: some tearing  OBJECTIVE:   DIAGNOSTIC FINDINGS:  none   COGNITION: Overall cognitive status: Within functional limits for tasks assessed     SENSATION: Light touch: Appears intact Proprioception: Appears intact    POSTURE: rounded shoulders, forward head, and decreased lumbar lordosis  PELVIC ALIGNMENT:04/17/23 right ilium is posteriorly rotated  LUMBARAROM/PROM:  A/PROM A/PROM  eval  Extension Decreased by 50%  Right rotation Decreased by 25%   (Blank rows = not tested)  LOWER EXTREMITY ROM: Bilateral hip ROM is full   LOWER EXTREMITY MMT:  MMT Right eval Left eval  Hip extension 4/5 4/5  Hip abduction 3/5 with pain 4/5   PALPATION:   General  tenderness located in the right psoas, lower abdomen, right hip adductor, tenderness located in the posterior right hip, lateral to right ischial tuberosity               Patient confirms  identification and approves PT to assess internal pelvic floor and treatment No  PELVIC MMT:   MMT 04/08/23  Vaginal 4/5  Internal Anal Sphincter   External Anal Sphincter   Puborectalis   Diastasis Recti   (Blank rows = not tested)         TODAY'S TREATMENT:    04/17/23 Manual: Soft tissue mobilization: Manual work to the right quadratus Spinal mobilization: Mobilization of right SI joint in supine Upslide and downslide of right side of L3-L5 Distraction of right hip with internal rotation and adduction Internal pelvic floor techniques: No emotional/communication barriers or cognitive limitation. Patient is motivated to learn. Patient understands and agrees with treatment goals and plan. PT explains patient will be examined in standing, sitting, and lying down to see how their muscles and joints work. When they are ready, they will be asked to remove their underwear so PT can examine their perineum. The patient is also given the option of providing their own chaperone as one is not provided in our facility. The patient also has the right and is explained the right to defer or refuse any part of the evaluation or treatment including the internal exam. With the patient's consent, PT will use one gloved finger to gently assess the muscles of the pelvic floor, seeing how well it contracts and relaxes and if there is muscle symmetry. After, the patient will get dressed and PT and patient will discuss exam findings and plan of care. PT and patient discuss plan of care, schedule, attendance policy and HEP activities.  Going through the vaginal working on the levator ani and obturator internist with muscles lengthening well Exercises: Stretches/mobility: Quadruped hip internal rotation with hip hinge Sitting hip hinge on right Strengthening: Quadruped right hip abduction 10 times with tactile cues to keep hip stable Quadruped move right leg into adduction and abduction      04/08/23 Manual: Soft tissue mobilization: Right calf to  improve tissue mobility Internal pelvic floor techniques: No emotional/communication barriers or cognitive limitation. Patient is motivated to learn. Patient understands and agrees with treatment goals and plan. PT explains patient will be examined in standing, sitting, and lying down to see how their muscles and joints work. When they are ready, they will be asked to remove their underwear so PT can examine their perineum. The patient is also given the option of providing their own chaperone as one is not provided in our facility. The patient also has the right and is explained the right to defer or refuse any part of the evaluation or treatment including the internal exam. With the patient's consent, PT will use one gloved finger to gently assess the muscles of the pelvic floor, seeing how well it contracts and relaxes and if there is muscle symmetry. After, the patient will get dressed and PT and patient will discuss exam findings and plan of care. PT and patient discuss plan of care, schedule, attendance policy and HEP activities.  Going through the vaginal canal working on the right obturator internist, iliococcygeus with right hip movement Educated patient on how to perform manual work to the right pelvic floor at home and she returned demonstration.  Exercises: Stretches/mobility: Sitting with ball between knees and move thigh back and forth Quadruped with right knee on yoga block moving the left hip up and down to mobilize right SI joint Educated patient on vaginal moisturizers and how to massage the cream into the vaginal canal     04/01/23 Manual: Soft tissue mobilization:  To assess for dry needling Manual work to the right hamstring, gemelli, piriformis, and gluteals Spinal mobilization: Right SI joint mobilization in prone Trigger Point Dry-Needling  Treatment instructions: Expect mild to moderate muscle soreness. S/S of  pneumothorax if dry needled over a lung field, and to seek immediate medical attention should they occur. Patient verbalized understanding of these instructions and education.  Patient Consent Given: Yes Education handout provided: Yes Muscles treated: right hamstring,  right hip inferior gemelli, right piriformis Electrical stimulation performed: No Parameters: N/A Treatment response/outcome: elongation of muscle and trigger point response Exercises: Stretches/mobility: Quadruped rock with hip internal rotation Standing hip rock with hip internal rotation Sitting with yoga block between knees and moving knee forward and back Sitting piriformis stretch holding 30 sec bil.  Sitting hip internal rotation 10 x                                                                                                                               DATE: 03/25/23  EVAL  Trigger Point Dry-Needling  Treatment instructions: Expect mild to moderate muscle soreness. S/S of pneumothorax if dry needled over a lung field, and to seek immediate medical attention should they occur. Patient verbalized understanding of these instructions and education.  Patient Consent Given: Yes Education handout provided: Yes Muscles treated: right hamstring, right hip adductor, right hip inferior gemllis Electrical stimulation performed:  No Parameters: N/A Treatment response/outcome: elongation of muscle and trigger point response Quadruped with hip internally rotated and rock back and forth Sit on a tennis ball and massage muscles    PATIENT EDUCATION: 04/17/23 Education details: Access Code: [email or web address], educated patient on vaginal moisturizers, educated patient on how to massage the right pelvic floor and gave her vaginal lubricant samples to use with the manual work Person educated: Patient Education method: Programmer, multimedia, Facilities manager, Actor cues, Verbal cues, and Handouts Education comprehension: verbalized understanding,  returned demonstration, verbal cues required, tactile cues required, and needs further education   HOME EXERCISE PROGRAM: 04/17/23 Access Code: [email or web address] URL: [email or web address] Date: 04/17/2023 Prepared by: Eulis Foster  Program Notes sitting with a roll of paper towel between knees and bring one knee forward than the other. 10 x   Exercises - Quadruped Rocking Slow  - 1 x daily - 7 x weekly - 2 sets - 5 reps - Seated Hip Internal Rotation AROM  - 1 x daily - 7 x weekly - 1 sets - 10 reps - Seated Piriformis Stretch with Trunk Bend  - 1 x daily - 7 x weekly - 1 sets - 2 reps - 30 sec hold - Quadruped Hip Abduction and External Rotation  - 1 x daily - 7 x weekly - 3 sets - 10 reps ASSESSMENT:  CLINICAL IMPRESSION: Patient is a 72 y.o. female who was seen today for physical therapy  treatment for right hip pain. Pain level decreased from 7/10 to 5/10.  Pelvic floor strength is 4/5. Patient has less pain. Her pelvis in correct alignment after therapy. She needs strengthening in the hip abduction and right SI joint. She has not more trigger points in the right pelvic floor. Pelvic floor strength  is still 4/5.  Patient will benefit from skilled therapy to reduce her pain and improve her strength.   OBJECTIVE IMPAIRMENTS: decreased strength, increased fascial restrictions, increased muscle spasms, and pain.   ACTIVITY LIMITATIONS: sitting and bed mobility  PARTICIPATION LIMITATIONS: community activity  PERSONAL FACTORS: Time since onset of injury/illness/exacerbation are also affecting patient's functional outcome.   REHAB POTENTIAL: Excellent  CLINICAL DECISION MAKING: Stable/uncomplicated  EVALUATION COMPLEXITY: Low   GOALS: Goals reviewed with patient? Yes  SHORT TERM GOALS: Target date: 04/17/23  Patient independent with initial HEP for stretches and right hip strength.  Baseline: Goal status: Met 04/01/23  2.  Patient is able to rolling in bed with pain  decreased >/= 25% due to decreased trigger points.  Baseline:  Goal status: Met 04/08/23  3.  Patient is able to sit with right leg crossed over left with pain level decreased >/= 25%. Baseline:  Goal status: met 04/08/23    LONG TERM GOALS: Target date: 05/20/23  Patient independent with advanced HEP for right hip strength.  Baseline:  Goal status: INITIAL  2.  Patient is able to roll in bed with pain level decreased </= 1-2/10.  Baseline:  Goal status: INITIAL  3.  Patient is able to sit with right leg over left with pain level </= 1-2/10 Baseline:  Goal status: INITIAL  4.  Right hip strength is >/= 4/5 without pain to stabilize during her tennis and exercise at the gym.  Baseline:  Goal status: INITIAL   PLAN:  PT FREQUENCY: 1x/week  PT DURATION: 8 weeks  PLANNED INTERVENTIONS: Therapeutic exercises, Therapeutic activity, Neuromuscular re-education, Patient/Family education, Joint mobilization, Dry Needling, Electrical stimulation, Cryotherapy, Moist heat, Taping, Ionotophoresis 4mg /ml Dexamethasone, and Manual therapy  PLAN FOR  NEXT SESSION:   right hip abduction strength; dry needling, SI joint stabilization   Eulis Foster, PT 04/17/23 1:12 PM

## 2023-04-24 ENCOUNTER — Ambulatory Visit: Payer: Medicare Other | Admitting: Physical Therapy

## 2023-04-24 ENCOUNTER — Encounter: Payer: Self-pay | Admitting: Physical Therapy

## 2023-04-24 DIAGNOSIS — M25551 Pain in right hip: Secondary | ICD-10-CM

## 2023-04-24 DIAGNOSIS — M6281 Muscle weakness (generalized): Secondary | ICD-10-CM | POA: Diagnosis not present

## 2023-04-24 NOTE — Therapy (Signed)
OUTPATIENT PHYSICAL THERAPY FEMALE PELVIC TREATMENT   Patient Name: Allison Webb MRN: [id number] DOB:07-02-1951, 72 y.o., female Today's Date: 04/24/2023  END OF SESSION:  PT End of Session - 04/24/23 0850     Visit Number 5    Date for PT Re-Evaluation 05/20/23    Authorization Type Medicare    Authorization - Visit Number 5    Authorization - Number of Visits 10    PT Start Time 0845    PT Stop Time 0925    PT Time Calculation (min) 40 min    Activity Tolerance Patient tolerated treatment well    Behavior During Therapy [facility] for tasks assessed/performed             Past Medical History:  Diagnosis Date   Asthma    Pt states she does not have asathma   Eczema    Endometriosis    Hyperlipidemia    Skin cancer    Past Surgical History:  Procedure Laterality Date   ANKLE SURGERY     right ankle   APPENDECTOMY     KNEE SURGERY     right knee   LAPAROTOMY     Patient Active Problem List   Diagnosis Date Noted   History of food allergy 04/24/2017   Allergic reaction 04/24/2017   Cough, persistent 05/17/2014   Chest pain 01/18/2011   Hyperlipidemia 01/18/2011   Chronic rhinitis 09/09/2007   ACUTE BRONCHOSPASM 09/09/2007   Endometriosis 09/09/2007   SKIN CANCER, HX OF 09/09/2007    PCP: Daisy Floro, MD  REFERRING PROVIDER: Daisy Floro, MD  REFERRING DIAG: M25.551 Right Hip Pain :M62.89 Pelvic Floor Instability  THERAPY DIAG:  Muscle weakness (generalized)  Pain in right hip  Rationale for Evaluation and Treatment: Rehabilitation  ONSET DATE: 2019  SUBJECTIVE:                                                                                                                                                                                           SUBJECTIVE STATEMENT: I do not think things hurt as much. I am still getting a catch in my right hip. Massage helped my hip.    PAIN:  Are you having pain? Yes NPRS scale: 5/10 when she  feels the catch, daily 1/10 (04/24/23) Pain location:  right hip  Pain type: shooting Pain description: intermittent   Aggravating factors: sleeping and roll over, sit with right leg over left Relieving factors: not rolling over, sitting push the right hip down  PRECAUTIONS: Other: skin cancer  RED FLAGS: None   WEIGHT BEARING RESTRICTIONS: No  FALLS:  Has patient fallen  in last 6 months? No  LIVING ENVIRONMENT: Lives with: lives with their spouse  OCCUPATION: tennis, gym 3 times per week, walk  PLOF: Independent  PATIENT GOALS: reduce right hip pain  PERTINENT HISTORY:  Appendectomy; Endometriosis; Skin Caner, osteopenia   BOWEL MOVEMENT:  Pain with bowel movement: No Type of bowel movement:Strain Yes   URINATION: Pain with urination: No Fully empty bladder: No, does not have  to stand up and sit down Stream: Strong Urgency: Yes: sometimes Frequency: depending on what she drinks Leakage: none 04/24/23   PREGNANCY: Vaginal deliveries 1 Tearing Yes: some tearing  OBJECTIVE:   DIAGNOSTIC FINDINGS:  none   COGNITION: Overall cognitive status: Within functional limits for tasks assessed     SENSATION: Light touch: Appears intact Proprioception: Appears intact    POSTURE: rounded shoulders, forward head, and decreased lumbar lordosis  PELVIC ALIGNMENT:04/17/23 right ilium is posteriorly rotated  LUMBARAROM/PROM:  A/PROM A/PROM  eval  Extension Decreased by 50%  Right rotation Decreased by 25%   (Blank rows = not tested)  LOWER EXTREMITY ROM: Bilateral hip ROM is full   LOWER EXTREMITY MMT:  MMT Right eval Left eval  Hip extension 4/5 4/5  Hip abduction 3/5 with pain 4/5   PALPATION:   General  tenderness located in the right psoas, lower abdomen, right hip adductor, tenderness located in the posterior right hip, lateral to right ischial tuberosity 04/24/23: stand with right hip shifted laterally               Patient confirms  identification and approves PT to assess internal pelvic floor and treatment No  PELVIC MMT:   MMT 04/08/23  Vaginal 4/5  Internal Anal Sphincter   External Anal Sphincter   Puborectalis   Diastasis Recti   (Blank rows = not tested)         TODAY'S TREATMENT:    04/24/23 Manual: Soft tissue mobilization: To assess for dry needling Manual work to the lumbar paraspinals and quadratus to elongate after dry needling Manual work to the right gluteal in sidely while bringing right hip forward, used a prickly ball to massage further Trigger Point Dry-Needling  Treatment instructions: Expect mild to moderate muscle soreness. S/S of pneumothorax if dry needled over a lung field, and to seek immediate medical attention should they occur. Patient verbalized understanding of these instructions and education.  Patient Consent Given: Yes Education handout provided: Previously provided Muscles treated: lumbar multifidi, right quadratus Electrical stimulation performed: No Parameters: N/A Treatment response/outcome: elongation of tissue and trigger point response Exercises: Stretches/mobility: Lateral glide of lumbar with left side on the wall and pressing the right pelvis to the wall 10 times  Strengthening: One legged bridge 10 x each  Single leg stance  on the right with red band around the knees to prevent the knee from turning inward Stand with knee flexed and push right foot into the wall to engage the gluteal  04/17/23 Manual: Soft tissue mobilization: Manual work to the right quadratus Spinal mobilization: Mobilization of right SI joint in supine Upslide and downslide of right side of L3-L5 Distraction of right hip with internal rotation and adduction Internal pelvic floor techniques: No emotional/communication barriers or cognitive limitation. Patient is motivated to learn. Patient understands and agrees with treatment goals and plan. PT explains patient will be examined in  standing, sitting, and lying down to see how their muscles and joints work. When they are ready, they will be asked to remove their underwear so PT can  examine their perineum. The patient is also given the option of providing their own chaperone as one is not provided in our facility. The patient also has the right and is explained the right to defer or refuse any part of the evaluation or treatment including the internal exam. With the patient's consent, PT will use one gloved finger to gently assess the muscles of the pelvic floor, seeing how well it contracts and relaxes and if there is muscle symmetry. After, the patient will get dressed and PT and patient will discuss exam findings and plan of care. PT and patient discuss plan of care, schedule, attendance policy and HEP activities.  Going through the vaginal working on the levator ani and obturator internist with muscles lengthening well Exercises: Stretches/mobility: Quadruped hip internal rotation with hip hinge Sitting hip hinge on right Strengthening: Quadruped right hip abduction 10 times with tactile cues to keep hip stable Quadruped move right leg into adduction and abduction     04/08/23 Manual: Soft tissue mobilization: Right calf to improve tissue mobility Internal pelvic floor techniques: No emotional/communication barriers or cognitive limitation. Patient is motivated to learn. Patient understands and agrees with treatment goals and plan. PT explains patient will be examined in standing, sitting, and lying down to see how their muscles and joints work. When they are ready, they will be asked to remove their underwear so PT can examine their perineum. The patient is also given the option of providing their own chaperone as one is not provided in our facility. The patient also has the right and is explained the right to defer or refuse any part of the evaluation or treatment including the internal exam. With the patient's consent, PT  will use one gloved finger to gently assess the muscles of the pelvic floor, seeing how well it contracts and relaxes and if there is muscle symmetry. After, the patient will get dressed and PT and patient will discuss exam findings and plan of care. PT and patient discuss plan of care, schedule, attendance policy and HEP activities.  Going through the vaginal canal working on the right obturator internist, iliococcygeus with right hip movement Educated patient on how to perform manual work to the right pelvic floor at home and she returned demonstration.  Exercises: Stretches/mobility: Sitting with ball between knees and move thigh back and forth Quadruped with right knee on yoga block moving the left hip up and down to mobilize right SI joint Educated patient on vaginal moisturizers and how to massage the cream into the vaginal canal                                                                PATIENT EDUCATION: 04/24/23 Education details: Access Code: [email or web address], educated patient on vaginal moisturizers, educated patient on how to massage the right pelvic floor and gave her vaginal lubricant samples to use with the manual work Person educated: Patient Education method: Programmer, multimedia, Facilities manager, Actor cues, Verbal cues, and Handouts Education comprehension: verbalized understanding, returned demonstration, verbal cues required, tactile cues required, and needs further education   HOME EXERCISE PROGRAM: 04/24/23 Access Code: [email or web address] URL: [email or web address] Date: 04/17/2023 Prepared by: Eulis Foster  Program Notes sitting with a roll of paper towel between knees and bring one knee forward than the other.  10 x   Exercises  Single Leg Bridge  - 1 x daily - 3 x weekly - 1 sets - 10 reps - Single Leg Partial Squat with Resistance  - 1 x daily - 3 x weekly - 1 sets - 10 reps   ASSESSMENT:  CLINICAL IMPRESSION: Patient is a 72 y.o. female who was seen today for  physical therapy  treatment for right hip pain. Pain level decreased from 7/10 to 5/10 when feels catch of the right hip. Daily pain is now 1/10. No urinary leakage. Able to fully empty her bladder without needing to go up and down. She stands with right hip to the right. She had tightness on the right lumbar spine. She needs cues to engage her right gluteals.   Patient will benefit from skilled therapy to reduce her pain and improve her strength.   OBJECTIVE IMPAIRMENTS: decreased strength, increased fascial restrictions, increased muscle spasms, and pain.   ACTIVITY LIMITATIONS: sitting and bed mobility  PARTICIPATION LIMITATIONS: community activity  PERSONAL FACTORS: Time since onset of injury/illness/exacerbation are also affecting patient's functional outcome.   REHAB POTENTIAL: Excellent  CLINICAL DECISION MAKING: Stable/uncomplicated  EVALUATION COMPLEXITY: Low   GOALS: Goals reviewed with patient? Yes  SHORT TERM GOALS: Target date: 04/17/23  Patient independent with initial HEP for stretches and right hip strength.  Baseline: Goal status: Met 04/01/23  2.  Patient is able to rolling in bed with pain decreased >/= 25% due to decreased trigger points.  Baseline:  Goal status: Met 04/08/23  3.  Patient is able to sit with right leg crossed over left with pain level decreased >/= 25%. Baseline:  Goal status: met 04/08/23    LONG TERM GOALS: Target date: 05/20/23  Patient independent with advanced HEP for right hip strength.  Baseline:  Goal status: INITIAL  2.  Patient is able to roll in bed with pain level decreased </= 1-2/10.  Baseline:  Goal status: INITIAL  3.  Patient is able to sit with right leg over left with pain level </= 1-2/10 Baseline:  Goal status: INITIAL  4.  Right hip strength is >/= 4/5 without pain to stabilize during her tennis and exercise at the gym.  Baseline:  Goal status: INITIAL   PLAN:  PT FREQUENCY: 1x/week  PT DURATION: 8  weeks  PLANNED INTERVENTIONS: Therapeutic exercises, Therapeutic activity, Neuromuscular re-education, Patient/Family education, Joint mobilization, Dry Needling, Electrical stimulation, Cryotherapy, Moist heat, Taping, Ionotophoresis 4mg /ml Dexamethasone, and Manual therapy  PLAN FOR NEXT SESSION:   right hip abduction strength; dry needling, SI joint stabilization   Eulis Foster, PT 04/24/23 9:32 AM

## 2023-04-29 ENCOUNTER — Ambulatory Visit: Payer: Medicare Other | Admitting: Physical Therapy

## 2023-04-29 ENCOUNTER — Encounter: Payer: Self-pay | Admitting: Physical Therapy

## 2023-04-29 DIAGNOSIS — M25551 Pain in right hip: Secondary | ICD-10-CM

## 2023-04-29 DIAGNOSIS — M6281 Muscle weakness (generalized): Secondary | ICD-10-CM

## 2023-04-29 NOTE — Therapy (Signed)
OUTPATIENT PHYSICAL THERAPY FEMALE PELVIC TREATMENT   Patient Name: Allison Webb MRN: [id number] DOB:07/24/1950, 72 y.o., female Today's Date: 04/29/2023  END OF SESSION:  PT End of Session - 04/29/23 1146     Visit Number 6    Date for PT Re-Evaluation 05/20/23    Authorization Type Medicare    Authorization - Visit Number 6    Authorization - Number of Visits 10    PT Start Time 1145    PT Stop Time 1225    PT Time Calculation (min) 40 min    Activity Tolerance Patient tolerated treatment well    Behavior During Therapy WFL for tasks assessed/performed             Past Medical History:  Diagnosis Date   Asthma    Pt states she does not have asathma   Eczema    Endometriosis    Hyperlipidemia    Skin cancer    Past Surgical History:  Procedure Laterality Date   ANKLE SURGERY     right ankle   APPENDECTOMY     KNEE SURGERY     right knee   LAPAROTOMY     Patient Active Problem List   Diagnosis Date Noted   History of food allergy 04/24/2017   Allergic reaction 04/24/2017   Cough, persistent 05/17/2014   Chest pain 01/18/2011   Hyperlipidemia 01/18/2011   Chronic rhinitis 09/09/2007   ACUTE BRONCHOSPASM 09/09/2007   Endometriosis 09/09/2007   SKIN CANCER, HX OF 09/09/2007    PCP: Daisy Floro, MD  REFERRING PROVIDER: Daisy Floro, MD  REFERRING DIAG: M25.551 Right Hip Pain :M62.89 Pelvic Floor Instability  THERAPY DIAG:  Muscle weakness (generalized)  Pain in right hip  Rationale for Evaluation and Treatment: Rehabilitation  ONSET DATE: 2019  SUBJECTIVE:                                                                                                                                                                                           SUBJECTIVE STATEMENT: Last session has helped. I did not have back pain for 2-3 days.     PAIN:  Are you having pain? Yes NPRS scale: 5/10 when she feels the catch, daily 1/10  (04/29/23) Pain location:  right hip  Pain type: shooting Pain description: intermittent   Aggravating factors: sleeping and roll over, sit with right leg over left Relieving factors: not rolling over, sitting push the right hip down  PRECAUTIONS: Other: skin cancer  RED FLAGS: None   WEIGHT BEARING RESTRICTIONS: No  FALLS:  Has patient fallen in last 6 months? No  LIVING ENVIRONMENT:  Lives with: lives with their spouse  OCCUPATION: tennis, gym 3 times per week, walk  PLOF: Independent  PATIENT GOALS: reduce right hip pain  PERTINENT HISTORY:  Appendectomy; Endometriosis; Skin Caner, osteopenia   BOWEL MOVEMENT:  Pain with bowel movement: No Type of bowel movement:Strain Yes   URINATION: Pain with urination: No Fully empty bladder: No, does not have  to stand up and sit down Stream: Strong Urgency: Yes: sometimes Frequency: depending on what she drinks Leakage: none 04/24/23   PREGNANCY: Vaginal deliveries 1 Tearing Yes: some tearing  OBJECTIVE:   DIAGNOSTIC FINDINGS:  none   COGNITION: Overall cognitive status: Within functional limits for tasks assessed     SENSATION: Light touch: Appears intact Proprioception: Appears intact    POSTURE: rounded shoulders, forward head, and decreased lumbar lordosis  PELVIC ALIGNMENT:04/17/23 right ilium is posteriorly rotated  LUMBARAROM/PROM:  A/PROM A/PROM  eval  Extension Decreased by 50%  Right rotation Decreased by 25%   (Blank rows = not tested)  LOWER EXTREMITY ROM: Bilateral hip ROM is full   LOWER EXTREMITY MMT:  MMT Right eval Left eval  Hip extension 4/5 4/5  Hip abduction 3/5 with pain 4/5   PALPATION:   General  tenderness located in the right psoas, lower abdomen, right hip adductor, tenderness located in the posterior right hip, lateral to right ischial tuberosity 04/24/23: stand with right hip shifted laterally               Patient confirms identification and approves PT  to assess internal pelvic floor and treatment No  PELVIC MMT:   MMT 04/08/23  Vaginal 4/5  Internal Anal Sphincter   External Anal Sphincter   Puborectalis   Diastasis Recti   (Blank rows = not tested)         TODAY'S TREATMENT:    04/29/23 Manual: Soft tissue mobilization: To assess for dry needling  Manual work to the lumbar paraspinals, quadratus, along the gluteus medius, piriformis and hip rotators to elongate after dry needling Spinal mobilization: PA mobilization grade 3 to bilateral SI joint Trigger Point Dry-Needling  Treatment instructions: Expect mild to moderate muscle soreness. S/S of pneumothorax if dry needled over a lung field, and to seek immediate medical attention should they occur. Patient verbalized understanding of these instructions and education.  Patient Consent Given: Yes Education handout provided: Previously provided Muscles treated: lumbar multifidi, right gluteus medius and piriformis Electrical stimulation performed: No Parameters: N/A Treatment response/outcome: elongation of tissue and trigger point response    04/24/23 Manual: Soft tissue mobilization: To assess for dry needling Manual work to the lumbar paraspinals and quadratus to elongate after dry needling Manual work to the right gluteal in sidely while bringing right hip forward, used a prickly ball to massage further Trigger Point Dry-Needling  Treatment instructions: Expect mild to moderate muscle soreness. S/S of pneumothorax if dry needled over a lung field, and to seek immediate medical attention should they occur. Patient verbalized understanding of these instructions and education.  Patient Consent Given: Yes Education handout provided: Previously provided Muscles treated: lumbar multifidi, right quadratus Electrical stimulation performed: No Parameters: N/A Treatment response/outcome: elongation of tissue and trigger point response Exercises: Stretches/mobility: Lateral  glide of lumbar with left side on the wall and pressing the right pelvis to the wall 10 times  Strengthening: One legged bridge 10 x each  Single leg stance  on the right with red band around the knees to prevent the knee from turning inward Stand with knee  flexed and push right foot into the wall to engage the gluteal  04/17/23 Manual: Soft tissue mobilization: Manual work to the right quadratus Spinal mobilization: Mobilization of right SI joint in supine Upslide and downslide of right side of L3-L5 Distraction of right hip with internal rotation and adduction Internal pelvic floor techniques: No emotional/communication barriers or cognitive limitation. Patient is motivated to learn. Patient understands and agrees with treatment goals and plan. PT explains patient will be examined in standing, sitting, and lying down to see how their muscles and joints work. When they are ready, they will be asked to remove their underwear so PT can examine their perineum. The patient is also given the option of providing their own chaperone as one is not provided in our facility. The patient also has the right and is explained the right to defer or refuse any part of the evaluation or treatment including the internal exam. With the patient's consent, PT will use one gloved finger to gently assess the muscles of the pelvic floor, seeing how well it contracts and relaxes and if there is muscle symmetry. After, the patient will get dressed and PT and patient will discuss exam findings and plan of care. PT and patient discuss plan of care, schedule, attendance policy and HEP activities.  Going through the vaginal working on the levator ani and obturator internist with muscles lengthening well Exercises: Stretches/mobility: Quadruped hip internal rotation with hip hinge Sitting hip hinge on right Strengthening: Quadruped right hip abduction 10 times with tactile cues to keep hip stable Quadruped move right leg  into adduction and abduction                                                                PATIENT EDUCATION: 04/24/23 Education details: Access Code: [email or web address], educated patient on vaginal moisturizers, educated patient on how to massage the right pelvic floor and gave her vaginal lubricant samples to use with the manual work Person educated: Patient Education method: Programmer, multimedia, Facilities manager, Actor cues, Verbal cues, and Handouts Education comprehension: verbalized understanding, returned demonstration, verbal cues required, tactile cues required, and needs further education   HOME EXERCISE PROGRAM: 04/24/23 Access Code: [email or web address] URL: [email or web address] Date: 04/17/2023 Prepared by: Eulis Foster  Program Notes sitting with a roll of paper towel between knees and bring one knee forward than the other. 10 x   Exercises  Single Leg Bridge  - 1 x daily - 3 x weekly - 1 sets - 10 reps - Single Leg Partial Squat with Resistance  - 1 x daily - 3 x weekly - 1 sets - 10 reps   ASSESSMENT:  CLINICAL IMPRESSION: Patient is a 72 y.o. female who was seen today for physical therapy  treatment for right hip pain. Patient had less trigger points in the lumbar multifidi and gluteal. She was restricted in bilateral SI joint. Patient has less pain. Patient responds well to dry needling.    Patient will benefit from skilled therapy to reduce her pain and improve her strength.   OBJECTIVE IMPAIRMENTS: decreased strength, increased fascial restrictions, increased muscle spasms, and pain.   ACTIVITY LIMITATIONS: sitting and bed mobility  PARTICIPATION LIMITATIONS: community activity  PERSONAL FACTORS: Time since onset of injury/illness/exacerbation are also affecting patient's functional outcome.   REHAB POTENTIAL:  Excellent  CLINICAL DECISION MAKING: Stable/uncomplicated  EVALUATION COMPLEXITY: Low   GOALS: Goals reviewed with patient? Yes  SHORT TERM GOALS: Target  date: 04/17/23  Patient independent with initial HEP for stretches and right hip strength.  Baseline: Goal status: Met 04/01/23  2.  Patient is able to rolling in bed with pain decreased >/= 25% due to decreased trigger points.  Baseline:  Goal status: Met 04/08/23  3.  Patient is able to sit with right leg crossed over left with pain level decreased >/= 25%. Baseline:  Goal status: met 04/08/23    LONG TERM GOALS: Target date: 05/20/23  Patient independent with advanced HEP for right hip strength.  Baseline:  Goal status: INITIAL  2.  Patient is able to roll in bed with pain level decreased </= 1-2/10.  Baseline:  Goal status: INITIAL  3.  Patient is able to sit with right leg over left with pain level </= 1-2/10 Baseline:  Goal status: INITIAL  4.  Right hip strength is >/= 4/5 without pain to stabilize during her tennis and exercise at the gym.  Baseline:  Goal status: INITIAL   PLAN:  PT FREQUENCY: 1x/week  PT DURATION: 8 weeks  PLANNED INTERVENTIONS: Therapeutic exercises, Therapeutic activity, Neuromuscular re-education, Patient/Family education, Joint mobilization, Dry Needling, Electrical stimulation, Cryotherapy, Moist heat, Taping, Ionotophoresis 4mg /ml Dexamethasone, and Manual therapy  PLAN FOR NEXT SESSION:   right hip abduction strength; dry needling, SI joint stabilization, discharge to HEP   Eulis Foster, PT 04/29/23 12:29 PM

## 2023-05-06 ENCOUNTER — Ambulatory Visit: Payer: Medicare Other | Admitting: Physical Therapy

## 2023-05-06 ENCOUNTER — Encounter: Payer: Self-pay | Admitting: Physical Therapy

## 2023-05-06 DIAGNOSIS — M25551 Pain in right hip: Secondary | ICD-10-CM

## 2023-05-06 DIAGNOSIS — M6281 Muscle weakness (generalized): Secondary | ICD-10-CM

## 2023-05-06 NOTE — Therapy (Signed)
OUTPATIENT PHYSICAL THERAPY FEMALE PELVIC TREATMENT   Patient Name: Allison Webb MRN: [id number] DOB:1951/02/21, 72 y.o., female Today's Date: 05/06/2023  END OF SESSION:  PT End of Session - 05/06/23 0842     Visit Number 7    Date for PT Re-Evaluation 05/20/23    Authorization Type Medicare    Authorization - Visit Number 7    Authorization - Number of Visits 10    PT Start Time 0845    PT Stop Time 0925    PT Time Calculation (min) 40 min    Activity Tolerance Patient tolerated treatment well    Behavior During Therapy [facility] for tasks assessed/performed             Past Medical History:  Diagnosis Date   Asthma    Pt states she does not have asathma   Eczema    Endometriosis    Hyperlipidemia    Skin cancer    Past Surgical History:  Procedure Laterality Date   ANKLE SURGERY     right ankle   APPENDECTOMY     KNEE SURGERY     right knee   LAPAROTOMY     Patient Active Problem List   Diagnosis Date Noted   History of food allergy 04/24/2017   Allergic reaction 04/24/2017   Cough, persistent 05/17/2014   Chest pain 01/18/2011   Hyperlipidemia 01/18/2011   Chronic rhinitis 09/09/2007   ACUTE BRONCHOSPASM 09/09/2007   Endometriosis 09/09/2007   SKIN CANCER, HX OF 09/09/2007    PCP: Daisy Floro, MD  REFERRING PROVIDER: Daisy Floro, MD  REFERRING DIAG: M25.551 Right Hip Pain :M62.89 Pelvic Floor Instability  THERAPY DIAG:  Muscle weakness (generalized)  Pain in right hip  Rationale for Evaluation and Treatment: Rehabilitation  ONSET DATE: 2019  SUBJECTIVE:                                                                                                                                                                                           SUBJECTIVE STATEMENT: I am feeling better.    PAIN:  Are you having pain? Yes NPRS scale: 5/10 when she feels the catch, daily 1/10 (04/29/23) Pain location:  right hip  Pain type:  shooting Pain description: intermittent   Aggravating factors: sit with right leg over left, bringing the right leg over the left Relieving factors: not rolling over  PRECAUTIONS: Other: skin cancer  RED FLAGS: None   WEIGHT BEARING RESTRICTIONS: No  FALLS:  Has patient fallen in last 6 months? No  LIVING ENVIRONMENT: Lives with: lives with their spouse  OCCUPATION: tennis, gym 3 times per  week, walk  PLOF: Independent  PATIENT GOALS: reduce right hip pain  PERTINENT HISTORY:  Appendectomy; Endometriosis; Skin Caner, osteopenia   BOWEL MOVEMENT:  Pain with bowel movement: No Type of bowel movement:Strain Yes   URINATION: Pain with urination: No Fully empty bladder: No, does not have  to stand up and sit down Stream: Strong Urgency: Yes: sometimes Frequency: depending on what she drinks Leakage: none 04/24/23   PREGNANCY: Vaginal deliveries 1 Tearing Yes: some tearing  OBJECTIVE:   DIAGNOSTIC FINDINGS:  none   COGNITION: Overall cognitive status: Within functional limits for tasks assessed     SENSATION: Light touch: Appears intact Proprioception: Appears intact    POSTURE: rounded shoulders, forward head, and decreased lumbar lordosis  PELVIC ALIGNMENT:04/17/23 right ilium is posteriorly rotated  LUMBARAROM/PROM:  A/PROM A/PROM  eval 05/06/23  Extension Decreased by 50% Decreased by 25%  Right rotation Decreased by 25% Decreased by 25%   (Blank rows = not tested)  LOWER EXTREMITY ROM: Bilateral hip ROM is full   LOWER EXTREMITY MMT:  MMT Right eval Left eval  Hip extension 4/5 4/5  Hip abduction 3/5 with pain 4/5   PALPATION:   General  tenderness located in the right psoas, lower abdomen, right hip adductor, tenderness located in the posterior right hip, lateral to right ischial tuberosity 04/24/23: stand with right hip shifted laterally               Patient confirms identification and approves PT to assess internal pelvic  floor and treatment No  PELVIC MMT:   MMT 04/08/23  Vaginal 4/5  (Blank rows = not tested)         TODAY'S TREATMENT:    05/06/23 Manual: Spinal mobilization: Gapping of the right L3-L5 grade 3  Belt on right hip distracting for lateral movement to stretch the posterior capsule Anterior glide of right hip grade 3 to stretch anterior capsule Exercises: Stretches/mobility: Strengthening: Prone hip extension with knee bent 20 x preventing outward movement Supine with trunk on the mat and bridge with 10 # on hips 10 x One legged dead lift without weight and verbal cues to not rotate spine Stand on one leg and rotated to work the gluteus [employer] on counter doing fire hydrant motion 15 x each side Single leg squat 10 x on right     04/29/23 Manual: Soft tissue mobilization: To assess for dry needling  Manual work to the lumbar paraspinals, quadratus, along the gluteus medius, piriformis and hip rotators to elongate after dry needling Spinal mobilization: PA mobilization grade 3 to bilateral SI joint Trigger Point Dry-Needling  Treatment instructions: Expect mild to moderate muscle soreness. S/S of pneumothorax if dry needled over a lung field, and to seek immediate medical attention should they occur. Patient verbalized understanding of these instructions and education.  Patient Consent Given: Yes Education handout provided: Previously provided Muscles treated: lumbar multifidi, right gluteus medius and piriformis Electrical stimulation performed: No Parameters: N/A Treatment response/outcome: elongation of tissue and trigger point response    04/24/23 Manual: Soft tissue mobilization: To assess for dry needling Manual work to the lumbar paraspinals and quadratus to elongate after dry needling Manual work to the right gluteal in sidely while bringing right hip forward, used a prickly ball to massage further Trigger Point Dry-Needling  Treatment instructions: Expect  mild to moderate muscle soreness. S/S of pneumothorax if dry needled over a lung field, and to seek immediate medical attention should they occur. Patient verbalized understanding of these  instructions and education.  Patient Consent Given: Yes Education handout provided: Previously provided Muscles treated: lumbar multifidi, right quadratus Electrical stimulation performed: No Parameters: N/A Treatment response/outcome: elongation of tissue and trigger point response Exercises: Stretches/mobility: Lateral glide of lumbar with left side on the wall and pressing the right pelvis to the wall 10 times  Strengthening: One legged bridge 10 x each  Single leg stance  on the right with red band around the knees to prevent the knee from turning inward Stand with knee flexed and push right foot into the wall to engage the gluteal                                                              PATIENT EDUCATION: 05/06/23 Education details: Access Code: [email or web address], educated patient on vaginal moisturizers, educated patient on how to massage the right pelvic floor and gave her vaginal lubricant samples to use with the manual work Person educated: Patient Education method: Programmer, multimedia, Facilities manager, Actor cues, Verbal cues, and Handouts Education comprehension: verbalized understanding, returned demonstration, verbal cues required, tactile cues required, and needs further education   HOME EXERCISE PROGRAM: 05/06/23 Access Code: [email or web address] URL: [email or web address] Date: 04/17/2023 Prepared by: Eulis Foster  Program Notes sitting with a roll of paper towel between knees and bring one knee forward than the other. 10 x   Exercises  Leaning Diagonal Hip Extension and External Rotation  - 1 x daily - 3 x weekly - 1-2 sets - 10 reps  ASSESSMENT:  CLINICAL IMPRESSION: Patient is a 72 y.o. female who was seen today for physical therapy  treatment for right hip pain. Patient had full  lumbar rotation of the lumbar after manual.   She still has some discomfort in posterior right hip when bringing right leg across body. She will abduct her right leg with right hip extension. Patient will benefit from skilled therapy to reduce her pain and improve her strength.   OBJECTIVE IMPAIRMENTS: decreased strength, increased fascial restrictions, increased muscle spasms, and pain.   ACTIVITY LIMITATIONS: sitting and bed mobility  PARTICIPATION LIMITATIONS: community activity  PERSONAL FACTORS: Time since onset of injury/illness/exacerbation are also affecting patient's functional outcome.   REHAB POTENTIAL: Excellent  CLINICAL DECISION MAKING: Stable/uncomplicated  EVALUATION COMPLEXITY: Low   GOALS: Goals reviewed with patient? Yes  SHORT TERM GOALS: Target date: 04/17/23  Patient independent with initial HEP for stretches and right hip strength.  Baseline: Goal status: Met 04/01/23  2.  Patient is able to rolling in bed with pain decreased >/= 25% due to decreased trigger points.  Baseline:  Goal status: Met 04/08/23  3.  Patient is able to sit with right leg crossed over left with pain level decreased >/= 25%. Baseline:  Goal status: met 04/08/23    LONG TERM GOALS: Target date: 05/20/23  Patient independent with advanced HEP for right hip strength.  Baseline:  Goal status: INITIAL  2.  Patient is able to roll in bed with pain level decreased </= 1-2/10.  Baseline:  Goal status: ongoing 05/06/23  3.  Patient is able to sit with right leg over left with pain level </= 1-2/10 Baseline:  Goal status: INITIAL  4.  Right hip strength is >/= 4/5 without pain to stabilize during her tennis and exercise at the gym.  Baseline:  Goal status: ongoing 05/06/23   PLAN:  PT FREQUENCY: 1x/week  PT DURATION: 8 weeks  PLANNED INTERVENTIONS: Therapeutic exercises, Therapeutic activity, Neuromuscular re-education, Patient/Family education, Joint mobilization, Dry  Needling, Electrical stimulation, Cryotherapy, Moist heat, Taping, Ionotophoresis 4mg /ml Dexamethasone, and Manual therapy  PLAN FOR NEXT SESSION:   right hip abduction strength; dry needling, SI joint stabilization, discharge to HEP   Eulis Foster, PT 05/06/23 9:25 AM

## 2023-05-13 ENCOUNTER — Encounter: Payer: Self-pay | Admitting: Physical Therapy

## 2023-05-13 ENCOUNTER — Ambulatory Visit: Payer: Medicare Other | Attending: Family Medicine | Admitting: Physical Therapy

## 2023-05-13 DIAGNOSIS — M25551 Pain in right hip: Secondary | ICD-10-CM | POA: Insufficient documentation

## 2023-05-13 DIAGNOSIS — M6281 Muscle weakness (generalized): Secondary | ICD-10-CM | POA: Diagnosis present

## 2023-05-13 NOTE — Therapy (Signed)
OUTPATIENT PHYSICAL THERAPY FEMALE PELVIC TREATMENT   Patient Name: Allison Webb MRN: [id number] DOB:1950/11/06, 72 y.o., female Today's Date: 05/13/2023  END OF SESSION:  PT End of Session - 05/13/23 1529     Visit Number 8    Date for PT Re-Evaluation 05/20/23    Authorization Type Medicare    Authorization - Visit Number 8    Authorization - Number of Visits 10    PT Start Time 1530    PT Stop Time 1610    PT Time Calculation (min) 40 min    Activity Tolerance Patient tolerated treatment well    Behavior During Therapy WFL for tasks assessed/performed             Past Medical History:  Diagnosis Date   Asthma    Pt states she does not have asathma   Eczema    Endometriosis    Hyperlipidemia    Skin cancer    Past Surgical History:  Procedure Laterality Date   ANKLE SURGERY     right ankle   APPENDECTOMY     KNEE SURGERY     right knee   LAPAROTOMY     Patient Active Problem List   Diagnosis Date Noted   History of food allergy 04/24/2017   Allergic reaction 04/24/2017   Cough, persistent 05/17/2014   Chest pain 01/18/2011   Hyperlipidemia 01/18/2011   Chronic rhinitis 09/09/2007   ACUTE BRONCHOSPASM 09/09/2007   Endometriosis 09/09/2007   SKIN CANCER, HX OF 09/09/2007    PCP: Daisy Floro, MD  REFERRING PROVIDER: Daisy Floro, MD  REFERRING DIAG: M25.551 Right Hip Pain :M62.89 Pelvic Floor Instability  THERAPY DIAG:  Muscle weakness (generalized)  Pain in right hip  Rationale for Evaluation and Treatment: Rehabilitation  ONSET DATE: 2019  SUBJECTIVE:                                                                                                                                                                                           SUBJECTIVE STATEMENT: I would like to review my exercises.     PAIN:  Are you having pain? Yes NPRS scale: 5/10 when she feels the catch, daily 1/10 (04/29/23) Pain location:  right  hip  Pain type: shooting Pain description: intermittent   Aggravating factors: sit with right leg over left, bringing the right leg over the left Relieving factors: not rolling over  PRECAUTIONS: Other: skin cancer  RED FLAGS: None   WEIGHT BEARING RESTRICTIONS: No  FALLS:  Has patient fallen in last 6 months? No  LIVING ENVIRONMENT: Lives with: lives with their spouse  OCCUPATION: tennis,  gym 3 times per week, walk  PLOF: Independent  PATIENT GOALS: reduce right hip pain  PERTINENT HISTORY:  Appendectomy; Endometriosis; Skin Caner, osteopenia   BOWEL MOVEMENT:  Pain with bowel movement: No Type of bowel movement:Strain Yes   URINATION: Pain with urination: No Fully empty bladder: No, does not have  to stand up and sit down Stream: Strong Urgency: Yes: sometimes Frequency: depending on what she drinks Leakage: none 04/24/23   PREGNANCY: Vaginal deliveries 1 Tearing Yes: some tearing  OBJECTIVE:   DIAGNOSTIC FINDINGS:  none   COGNITION: Overall cognitive status: Within functional limits for tasks assessed     SENSATION: Light touch: Appears intact Proprioception: Appears intact    POSTURE: rounded shoulders, forward head, and decreased lumbar lordosis  PELVIC ALIGNMENT:04/17/23 right ilium is posteriorly rotated  LUMBARAROM/PROM:  A/PROM A/PROM  eval 05/06/23  Extension Decreased by 50% Decreased by 25%  Right rotation Decreased by 25% Decreased by 25%   (Blank rows = not tested)  LOWER EXTREMITY ROM: Bilateral hip ROM is full   LOWER EXTREMITY MMT:  MMT Right eval Left eval Right 05/13/23 Left 05/13/23  Hip extension 4/5 4/5 4/5 4/5  Hip abduction 3/5 with pain 4/5 4/5 4/5   PALPATION:   General  tenderness located in the right psoas, lower abdomen, right hip adductor, tenderness located in the posterior right hip, lateral to right ischial tuberosity 04/24/23: stand with right hip shifted laterally               Patient  confirms identification and approves PT to assess internal pelvic floor and treatment No  PELVIC MMT:   MMT 04/08/23  Vaginal 4/5  (Blank rows = not tested)         TODAY'S TREATMENT:    05/13/23 Manual: Soft tissue mobilization: To assess for dry needling Manual work to the right gluteus medius and piriformis to elongate after dry needling Trigger Point Dry-Needling  Treatment instructions: Expect mild to moderate muscle soreness. S/S of pneumothorax if dry needled over a lung field, and to seek immediate medical attention should they occur. Patient verbalized understanding of these instructions and education.  Patient Consent Given: Yes Education handout provided: Previously provided Muscles treated: right gluteus medius and piriformis Electrical stimulation performed: No Parameters: N/A Treatment response/outcome: elongation of tissue and trigger point response Exercises: Stretches/mobility: Marjo Bicker pose hold for 30 sec. All 3 directions holding 30 seconds Sitting trunk rotation with legs crossed holding 30 sec bil.  Strengthening: Lean on counter doing fire hydrant motion 15 x each side Single leg squat 10 x bilateral  Stand with foot on wall to contract the gluteals Sitting hip internal rotation    05/06/23 Manual: Spinal mobilization: Gapping of the right L3-L5 grade 3  Belt on right hip distracting for lateral movement to stretch the posterior capsule Anterior glide of right hip grade 3 to stretch anterior capsule Exercises: Strengthening: Prone hip extension with knee bent 20 x preventing outward movement Supine with trunk on the mat and bridge with 10 # on hips 10 x One legged dead lift without weight and verbal cues to not rotate spine Stand on one leg and rotated to work the gluteus [employer] on counter doing fire hydrant motion 15 x each side Single leg squat 10 x on right     04/29/23 Manual: Soft tissue mobilization: To assess for dry needling   Manual work to the lumbar paraspinals, quadratus, along the gluteus medius, piriformis and hip rotators to elongate after dry  needling Spinal mobilization: PA mobilization grade 3 to bilateral SI joint Trigger Point Dry-Needling  Treatment instructions: Expect mild to moderate muscle soreness. S/S of pneumothorax if dry needled over a lung field, and to seek immediate medical attention should they occur. Patient verbalized understanding of these instructions and education.  Patient Consent Given: Yes Education handout provided: Previously provided Muscles treated: lumbar multifidi, right gluteus medius and piriformis Electrical stimulation performed: No Parameters: N/A Treatment response/outcome: elongation of tissue and trigger point response      PATIENT EDUCATION: 05/13/23 Education details: Access Code: [email or web address], educated patient on vaginal moisturizers, educated patient on how to massage the right pelvic floor and gave her vaginal lubricant samples to use with the manual work Person educated: Patient Education method: Programmer, multimedia, Facilities manager, Actor cues, Verbal cues, and Handouts Education comprehension: verbalized understanding, returned demonstration, verbal cues required, tactile cues required, and needs further education   HOME EXERCISE PROGRAM: 05/13/23 Access Code: [email or web address] URL: [email or web address] Date: 05/13/2023 Prepared by: Eulis Foster  Program Notes sitting with a roll of paper towel between knees and bring one knee forward than the other. 10 x   Exercises - Quadruped Rocking Slow  - 1 x daily - 7 x weekly - 2 sets - 5 reps - Seated Hip Internal Rotation AROM  - 1 x daily - 7 x weekly - 1 sets - 10 reps - Seated Piriformis Stretch with Trunk Bend  - 1 x daily - 7 x weekly - 1 sets - 2 reps - 30 sec hold - Quadruped Hip Abduction and External Rotation  - 1 x daily - 7 x weekly - 3 sets - 10 reps - Single Leg Bridge  - 1 x daily - 3 x weekly -  1 sets - 10 reps - Single Leg Partial Squat with Resistance  - 1 x daily - 3 x weekly - 1 sets - 10 reps - Leaning Diagonal Hip Extension and External Rotation  - 1 x daily - 3 x weekly - 1-2 sets - 10 reps - Child's Pose Stretch  - 1 x daily - 7 x weekly - 1 sets - 1 reps - 30 sec hold - Child's Pose with Sidebending  - 1 x daily - 7 x weekly - 1 sets - 1 reps - 30 sec hold  ASSESSMENT:  CLINICAL IMPRESSION: Patient is a 72 y.o. female who was seen today for physical therapy  treatment for right hip pain. Patient had full lumbar rotation of the lumbar after manual.   She has some weakness in the right hip abductors.  Patient will benefit from skilled therapy to reduce her pain and improve her strength. She is independent with her HEP. She is able to move and roll in bed with pain level 2/10.   OBJECTIVE IMPAIRMENTS: decreased strength, increased fascial restrictions, increased muscle spasms, and pain.   ACTIVITY LIMITATIONS: sitting and bed mobility  PARTICIPATION LIMITATIONS: community activity  PERSONAL FACTORS: Time since onset of injury/illness/exacerbation are also affecting patient's functional outcome.   REHAB POTENTIAL: Excellent  CLINICAL DECISION MAKING: Stable/uncomplicated  EVALUATION COMPLEXITY: Low   GOALS: Goals reviewed with patient? Yes  SHORT TERM GOALS: Target date: 04/17/23  Patient independent with initial HEP for stretches and right hip strength.  Baseline: Goal status: Met 04/01/23  2.  Patient is able to rolling in bed with pain decreased >/= 25% due to decreased trigger points.  Baseline:  Goal status: Met 04/08/23  3.  Patient is able to sit with right leg  crossed over left with pain level decreased >/= 25%. Baseline:  Goal status: met 04/08/23    LONG TERM GOALS: Target date: 05/20/23  Patient independent with advanced HEP for right hip strength.  Baseline:  Goal status: Met 05/13/23  2.  Patient is able to roll in bed with pain level  decreased </= 1-2/10.  Baseline:  Goal status: Met 05/13/23  3.  Patient is able to sit with right leg over left with pain level </= 1-2/10 Baseline:  Goal status: Met 05/13/23  4.  Right hip strength is >/= 4/5 without pain to stabilize during her tennis and exercise at the gym.  Baseline:  Goal status: Met 05/13/23   PLAN: Discharge to HEP this visit    Eulis Foster, PT 05/13/23 4:08 PM  PHYSICAL THERAPY DISCHARGE SUMMARY  Visits from Start of Care: 8  Current functional level related to goals / functional outcomes: See above.    Remaining deficits: See above.    Education / Equipment: HEP   Patient agrees to discharge. Patient goals were met. Patient is being discharged due to meeting the stated rehab goals. Thank you for the referral.   Velecia Ovitt, PT 05/13/23 4:08 PM

## 2023-07-09 ENCOUNTER — Encounter (HOSPITAL_BASED_OUTPATIENT_CLINIC_OR_DEPARTMENT_OTHER): Payer: Self-pay

## 2023-09-02 ENCOUNTER — Other Ambulatory Visit: Payer: Self-pay | Admitting: Obstetrics & Gynecology

## 2023-09-02 DIAGNOSIS — Z1231 Encounter for screening mammogram for malignant neoplasm of breast: Secondary | ICD-10-CM

## 2023-09-05 ENCOUNTER — Ambulatory Visit
Admission: RE | Admit: 2023-09-05 | Discharge: 2023-09-05 | Discharge status: Home / Self Care | Payer: No Typology Code available for payment source | Source: Ambulatory Visit | Attending: Obstetrics & Gynecology | Admitting: Obstetrics & Gynecology

## 2023-09-05 DIAGNOSIS — Z1231 Encounter for screening mammogram for malignant neoplasm of breast: Secondary | ICD-10-CM

## 2024-05-13 NOTE — Therapy (Unsigned)
 OUTPATIENT PHYSICAL THERAPY FEMALE PELVIC EVALUATION   Patient Name: Allison Webb MRN: [id number] DOB:March 16, 1951, 73 y.o., female Today's Date: 05/14/2024  END OF SESSION:  PT End of Session - 05/14/24 1358     Visit Number 1    Date for Recertification  08/06/24    Authorization Type medicare    Authorization - Visit Number 1    Authorization - Number of Visits 10    PT Start Time 0930    PT Stop Time 1020    PT Time Calculation (min) 50 min    Activity Tolerance Patient tolerated treatment well    Behavior During Therapy WFL for tasks assessed/performed          Past Medical History:  Diagnosis Date   Asthma    Pt states she does not have asathma   Eczema    Endometriosis    Hyperlipidemia    Skin cancer    Past Surgical History:  Procedure Laterality Date   ANKLE SURGERY     right ankle   APPENDECTOMY     KNEE SURGERY     right knee   LAPAROTOMY     Patient Active Problem List   Diagnosis Date Noted   History of food allergy  04/24/2017   Allergic reaction 04/24/2017   Cough, persistent 05/17/2014   Chest pain 01/18/2011   Hyperlipidemia 01/18/2011   Chronic rhinitis 09/09/2007   ACUTE BRONCHOSPASM 09/09/2007   Endometriosis 09/09/2007   SKIN CANCER, HX OF 09/09/2007    PCP: Okey Carlin Redbird, MD  REFERRING PROVIDER: Okey Carlin Redbird, MD   REFERRING DIAG: N39.3 (ICD-10-CM) - Stress incontinence (female) (female)   THERAPY DIAG:  Muscle weakness (generalized)  Other lack of coordination  Rationale for Evaluation and Treatment: Rehabilitation  ONSET DATE: 04/2024  SUBJECTIVE:                                                                                                                                                                                           SUBJECTIVE STATEMENT: I will sit at night and will leak urine. I leak urine from 7-9 PM.  Fluid intake: water, coffee, soda,   FUNCTIONAL LIMITATIONS: leak urine  PERTINENT  HISTORY:  Medications for current condition: none Surgeries: Appendectomy; Laparotomy Other: Endometriosis; Skin Cancer Sexual abuse: No  PAIN:  Are you having pain? No  PRECAUTIONS: Other: skin cancer  RED FLAGS: None   WEIGHT BEARING RESTRICTIONS: No  FALLS:  Has patient fallen in last 6 months? Yes. Number of falls hurrying on steps and grabbed doorframe, not due to balance  OCCUPATION: House wife  ACTIVITY LEVEL : tennis 2  times per week  PLOF: Independent  PATIENT GOALS: not leak urine, check right hip   URINATION: Pain with urination: No Fully empty bladder: Nosometimes has to double void                                         Post-void dribble: No Stream: Strong Urgency: No Frequency:during the day every 2-3 hours                                                        Nocturia: No   Leakage: Coughing and Sneezing; when leaks it will spit a small amount of urine Pads/briefs: No  INTERCOURSE:  Ability to have vaginal penetration Yes  Pain with intercourse: none  PREGNANCY: Vaginal deliveries 1 Tearing Yes:    PROLAPSE: Bulge   OBJECTIVE:  Note: Objective measures were completed at Evaluation unless otherwise noted.   PATIENT SURVEYS:  PFIQ-7: 11 UIQ-7 14   COGNITION: Overall cognitive status: Within functional limits for tasks assessed     SENSATION: Light touch: Appears intact    FUNCTIONAL TESTS:  Single leg stance:  Rt: increased trunk sway for 3 sec Squat: decreased lumbar extension  GAIT: Assistive device utilized: None   POSTURE: rounded shoulders, forward head, and decreased lumbar lordosis   LUMBARAROM/PROM: decreased by 25%    LOWER EXTREMITY ROM: bilateral hip ROM is full   LOWER EXTREMITY MMT:  MMT Right eval Left eval  Hip flexion 4/5 5/5  Hip extension 4/5 4/5  Hip abduction 2+/5 4/5   (Blank rows = not tested) PALPATION:   Pelvic Alignment: ASIS are equal  Abdominal: contract the abdominals with  slight bulge of the lower abdominals.   Diastasis: No Distortion: No  Breathing: upper rib cage breath Scar tissue: No                External Perineal Exam: tenderness located on the medial ischiotuberosity on the right                             Internal Pelvic Floor: tenderness located on the alcock's canal, when contracting the pelvic floor there is a slight bulge., tightness in the posterior vaginal canal  Patient confirms identification and approves PT to assess internal pelvic floor and treatment Yes All internal or external pelvic floor assessments and/or treatments are completed with proper hand hygiene and gloves hands. If needed gloves are changed with hand hygiene during patient care time. No emotional/communication barriers or cognitive limitation. Patient is motivated to learn. Patient understands and agrees with treatment goals and plan. PT explains patient will be examined in standing, sitting, and lying down to see how their muscles and joints work. When they are ready, they will be asked to remove their underwear so PT can examine their perineum. The patient is also given the option of providing their own chaperone as one is not provided in our facility. The patient also has the right and is explained the right to defer or refuse any part of the evaluation or treatment including the internal exam. With the patient's consent, PT will use one gloved finger to gently assess the muscles of the pelvic floor,  seeing how well it contracts and relaxes and if there is muscle symmetry. After, the patient will get dressed and PT and patient will discuss exam findings and plan of care. PT and patient discuss plan of care, schedule, attendance policy and HEP activities.   PELVIC MMT:   MMT eval  Vaginal 4/5  (Blank rows = not tested)        TONE: Increased on the right  PROLAPSE: none  TODAY'S TREATMENT:                                                                                                                               DATE: 05/14/24  EVAL Examination completed, findings reviewed, pt educated on POC, HEP, and female pelvic floor anatomy, reasoning with pelvic floor assessment internally with pt consent. Pt motivated to participate in PT and agreeable to attempt recommendations.     PATIENT EDUCATION:  Education details: Access Code: [email or web address] Person educated: Patient Education method: Explanation, Demonstration, Tactile cues, Verbal cues, and Handouts Education comprehension: verbalized understanding, returned demonstration, verbal cues required, tactile cues required, and needs further education  HOME EXERCISE PROGRAM: 05/14/24 Access Code: [email or web address] URL: [email or web address] Date: 05/14/2024 Prepared by: Channing Pereyra  Exercises - Supine Diaphragmatic Breathing  - 2 x daily - 7 x weekly - 1 sets - 10 reps - Seated Diaphragmatic Breathing  - 1 x daily - 7 x weekly - 1 sets - 10 reps - Hooklying Transversus Abdominis Palpation  - 3 x daily - 7 x weekly - 1 sets - 10 reps - Sidelying Diagonal Hip Abduction  - 1 x daily - 7 x weekly - 2 sets - 10 reps  ASSESSMENT:  CLINICAL IMPRESSION: Patient is a 73 y.o. female who was seen today for physical therapy evaluation and treatment for stress urinary leakage. Patient reports she started to have urinary leakage several weeks ago with  coughing and sneezing; when leaks it will spit a small amount of urine. Patient will typically leak urine from 5-7PM. Pelvic floor strength is 4/5 with slight bulge. She will bulge the lower abdomen with abdominal contraction. She has tenderness located on alcock's canal internally and externally. She has weakness in the right hip. Patient will benefit from skilled therapy to improve pelvic floor coordination strength to reduce the leakage.   OBJECTIVE IMPAIRMENTS: decreased coordination, decreased endurance, and decreased strength.   ACTIVITY LIMITATIONS:  continence  PARTICIPATION LIMITATIONS: community activity  PERSONAL FACTORS: 1-2 comorbidities: Endometriosis; Skin Cancer are also affecting patient's functional outcome.   REHAB POTENTIAL: Excellent  CLINICAL DECISION MAKING: Stable/uncomplicated  EVALUATION COMPLEXITY: Low   GOALS: Goals reviewed with patient? Yes  SHORT TERM GOALS: Target date: 06/11/24  Patient able to perform diaphragmatic breathing to relax her pelvic floor.  Baseline: Goal status: INITIAL  2.  Patient independent with hip strength to stabilize the pelvis.  Baseline:  Goal status: INITIAL   LONG TERM GOALS: Target date: 08/06/24  Patient is independent  with advanced HEP for core, pelvic floor and hip strength to reduce continence.  Baseline:  Goal status: INITIAL  2.  Patient is able to go through the day from 5-7 PM without urinary leakage due to increased endurance of the pelvic floor muscles. Baseline:  Goal status: INITIAL  3.  Patient is able to coughing and sneezing without leaking due to the ability to perform the [facility] to prepare the pelvic floor for the activity.  Baseline:  Goal status: INITIAL  4.  Patient is able to contract the pelvic floor and lower abdominals correctly to reduce pressure on the pelvic floor and reduce urinary leakage. Baseline:  Goal status: INITIAL  PLAN:  PT FREQUENCY: 1-2x/week  PT DURATION: 12 weeks  PLANNED INTERVENTIONS: 97110-Therapeutic exercises, 97530- Therapeutic activity, 97112- Neuromuscular re-education, 97535- Self Care, [id number]- Manual therapy, and Patient/Family education; biofeedback; joint mobilization  PLAN FOR NEXT SESSION: diaphragmatic breathing, manual work to the right pelvic floor, hip stretches   Channing Pereyra, PT 05/14/24 4:08 PM

## 2024-05-14 ENCOUNTER — Other Ambulatory Visit: Payer: Self-pay

## 2024-05-14 ENCOUNTER — Encounter: Attending: Family Medicine | Admitting: Physical Therapy

## 2024-05-14 ENCOUNTER — Encounter: Payer: Self-pay | Admitting: Physical Therapy

## 2024-05-14 DIAGNOSIS — M6281 Muscle weakness (generalized): Secondary | ICD-10-CM | POA: Insufficient documentation

## 2024-05-14 DIAGNOSIS — R278 Other lack of coordination: Secondary | ICD-10-CM | POA: Insufficient documentation

## 2024-06-10 ENCOUNTER — Ambulatory Visit: Attending: Family Medicine | Admitting: Physical Therapy

## 2024-06-10 ENCOUNTER — Encounter: Payer: Self-pay | Admitting: Physical Therapy

## 2024-06-10 DIAGNOSIS — M6281 Muscle weakness (generalized): Secondary | ICD-10-CM | POA: Insufficient documentation

## 2024-06-10 DIAGNOSIS — R278 Other lack of coordination: Secondary | ICD-10-CM | POA: Insufficient documentation

## 2024-06-10 DIAGNOSIS — M25551 Pain in right hip: Secondary | ICD-10-CM | POA: Insufficient documentation

## 2024-06-10 NOTE — Therapy (Signed)
 OUTPATIENT PHYSICAL THERAPY FEMALE PELVIC TREATMENT   Patient Name: Allison Webb MRN: [id number] DOB:[birth year], 73 y.o., female Today's Date: 06/10/2024  END OF SESSION:  PT End of Session - 06/10/24 1607     Visit Number 2    Date for Recertification  08/06/24    Authorization Type medicare    Authorization - Visit Number 2    Authorization - Number of Visits 10    PT Start Time 1610    PT Stop Time 1650    PT Time Calculation (min) 40 min    Activity Tolerance Patient tolerated treatment well    Behavior During Therapy WFL for tasks assessed/performed          Past Medical History:  Diagnosis Date   Asthma    Pt states she does not have asathma   Eczema    Endometriosis    Hyperlipidemia    Skin cancer    Past Surgical History:  Procedure Laterality Date   ANKLE SURGERY     right ankle   APPENDECTOMY     KNEE SURGERY     right knee   LAPAROTOMY     Patient Active Problem List   Diagnosis Date Noted   History of food allergy  04/24/2017   Allergic reaction 04/24/2017   Cough, persistent 05/17/2014   Chest pain 01/18/2011   Hyperlipidemia 01/18/2011   Chronic rhinitis 09/09/2007   ACUTE BRONCHOSPASM 09/09/2007   Endometriosis 09/09/2007   SKIN CANCER, HX OF 09/09/2007    PCP: Okey Carlin Redbird, MD  REFERRING PROVIDER: Okey Carlin Redbird, MD   REFERRING DIAG: N39.3 (ICD-10-CM) - Stress incontinence (female) (female)   THERAPY DIAG:  Muscle weakness (generalized)  Other lack of coordination  Pain in right hip  Rationale for Evaluation and Treatment: Rehabilitation  ONSET DATE: 04/2024  SUBJECTIVE:                                                                                                                                                                                           SUBJECTIVE STATEMENT: I have been doing the leg exercises. I am not squirting a little urine at night for several days. The leakage is 50% better.   Fluid intake:  water, coffee, soda,   FUNCTIONAL LIMITATIONS: leak urine  PERTINENT HISTORY:  Medications for current condition: none Surgeries: Appendectomy; Laparotomy Other: Endometriosis; Skin Cancer Sexual abuse: No  PAIN:  Are you having pain? No  PRECAUTIONS: Other: skin cancer  RED FLAGS: None   WEIGHT BEARING RESTRICTIONS: No  FALLS:  Has patient fallen in last 6 months? Yes. 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 LEVEL : tennis 2 times per week  PLOF: Independent  PATIENT GOALS: not leak urine, check right hip   URINATION: Pain with urination: No Fully empty bladder: Nosometimes has to double void                                         Post-void dribble: No Stream: Strong Urgency: No Frequency:during the day every 2-3 hours                                                        Nocturia: No   Leakage: Coughing and Sneezing; when leaks it will spit a small amount of urine Pads/briefs: No  INTERCOURSE:  Ability to have vaginal penetration Yes  Pain with intercourse: none  PREGNANCY: Vaginal deliveries 1 Tearing Yes:    PROLAPSE: Bulge   OBJECTIVE:  Note: Objective measures were completed at Evaluation unless otherwise noted.   PATIENT SURVEYS:  PFIQ-7: 11 UIQ-7 14   COGNITION: Overall cognitive status: Within functional limits for tasks assessed     SENSATION: Light touch: Appears intact    FUNCTIONAL TESTS:  Single leg stance:  Rt: increased trunk sway for 3 sec Squat: decreased lumbar extension  GAIT: Assistive device utilized: None   POSTURE: rounded shoulders, forward head, and decreased lumbar lordosis   LUMBARAROM/PROM: decreased by 25%    LOWER EXTREMITY ROM: bilateral hip ROM is full   LOWER EXTREMITY MMT:  MMT Right eval Left eval  Hip flexion 4/5 5/5  Hip extension 4/5 4/5  Hip abduction 2+/5 4/5   (Blank rows = not  tested) PALPATION:   Pelvic Alignment: ASIS are equal  Abdominal: contract the abdominals with slight bulge of the lower abdominals.   Diastasis: No Distortion: No  Breathing: upper rib cage breath Scar tissue: No                External Perineal Exam: tenderness located on the medial ischiotuberosity on the right                             Internal Pelvic Floor: tenderness located on the alcock's canal, when contracting the pelvic floor there is a slight bulge., tightness in the posterior vaginal canal  Patient confirms identification and approves PT to assess internal pelvic floor and treatment Yes All internal or external pelvic floor assessments and/or treatments are completed with proper hand hygiene and gloves hands. If needed gloves are changed with hand hygiene during patient care time. No emotional/communication barriers or cognitive limitation. Patient is motivated to learn. Patient understands and agrees with treatment goals and plan. PT explains patient will be examined in standing, sitting, and lying down to see how their muscles and joints work. When they are ready, they will be asked to remove their underwear so PT can examine their perineum. The patient is also given the option of providing their own chaperone as one is not provided in our facility. The patient also has the right and is explained the right to defer or refuse any part of the evaluation or treatment including the internal exam. With the patient's consent,  PT will use one gloved finger to gently assess the muscles of the pelvic floor, seeing how well it contracts and relaxes and if there is muscle symmetry. After, the patient will get dressed and PT and patient will discuss exam findings and plan of care. PT and patient discuss plan of care, schedule, attendance policy and HEP activities.   PELVIC MMT:   MMT eval 06/10/24  Vaginal 4/5 5/5  (Blank rows = not tested)        TONE: Increased on the  right  PROLAPSE: none  TODAY'S TREATMENT:     06/10/24 Manual: Internal pelvic floor techniques: No emotional/communication barriers or cognitive limitation. Patient is motivated to learn. Patient understands and agrees with treatment goals and plan. PT explains patient will be examined in standing, sitting, and lying down to see how their muscles and joints work. When they are ready, they will be asked to remove their underwear so PT can examine their perineum. The patient is also given the option of providing their own chaperone as one is not provided in our facility. The patient also has the right and is explained the right to defer or refuse any part of the evaluation or treatment including the internal exam. With the patient's consent, PT will use one gloved finger to gently assess the muscles of the pelvic floor, seeing how well it contracts and relaxes and if there is muscle symmetry. After, the patient will get dressed and PT and patient will discuss exam findings and plan of care. PT and patient discuss plan of care, schedule, attendance policy and HEP activities.  Therapist finger in the vaginal canal working on the levator ani and around the alcock's canal to lengthen the tissue Neuromuscular re-education: Core retraining: Supine contracting the transverse abdominus with hands on the lower abdomen using tactile cue 20 x Laying on side hip abduction with core engaged and tactile cues to bring leg up and not forward 10 x 2 Supine marching with core engaged 20 x  Quadruped lift opposite arm and leg but not able to do that Quadruped lift left with cues to keep pelvis balanced 5 x but arms were tired Leaning on counter and lift leg 10 x each side Down training: Diaphragmatic breathing in supine 20 x with tactile cues and verbal cues                                                                                                                             DATE: 05/14/24  EVAL Examination  completed, findings reviewed, pt educated on POC, HEP, and female pelvic floor anatomy, reasoning with pelvic floor assessment internally with pt consent. Pt motivated to participate in PT and agreeable to attempt recommendations.     PATIENT EDUCATION:  06/10/24 Education details: Access Code: [email or web address] Person educated: Patient Education method: Explanation, Demonstration, Tactile cues, Verbal cues, and Handouts Education comprehension: verbalized understanding, returned demonstration, verbal cues required, tactile cues required, and needs further education  HOME EXERCISE PROGRAM: 06/10/24 Access Code: [email or web address] URL: [email or web address] Date: 06/10/2024 Prepared by: Channing Pereyra  Exercises - Supine Diaphragmatic Breathing  - 2 x daily - 7 x weekly - 1 sets - 10 reps - Seated Diaphragmatic Breathing  - 1 x daily - 7 x weekly - 1 sets - 10 reps - Hooklying Transversus Abdominis Palpation  - 3 x daily - 7 x weekly - 1 sets - 10 reps - Sidelying Diagonal Hip Abduction  - 1 x daily - 7 x weekly - 2 sets - 10 reps - Supine March  - 1 x daily - 3 x weekly - 2 sets - 10 reps - Prone Hip Extension on Table  - 1 x daily - 3 x weekly - 2 sets - 10 reps  ASSESSMENT:  CLINICAL IMPRESSION: Patient is a 73 y.o. female who was seen today for physical therapy  treatment for stress urinary leakage.  Patient reports her urinary leakage is 50% better and does not happen daily now. Pelvic floor strength increased to 5/5. She had less tenderness located on the pelvic floor muscles. Patient will benefit from skilled therapy to improve pelvic floor coordination strength to reduce the leakage.   OBJECTIVE IMPAIRMENTS: decreased coordination, decreased endurance, and decreased strength.   ACTIVITY LIMITATIONS: continence  PARTICIPATION LIMITATIONS: community activity  PERSONAL FACTORS: 1-2 comorbidities: Endometriosis; Skin Cancer are also affecting patient's functional outcome.   REHAB  POTENTIAL: Excellent  CLINICAL DECISION MAKING: Stable/uncomplicated  EVALUATION COMPLEXITY: Low   GOALS: Goals reviewed with patient? Yes  SHORT TERM GOALS: Target date: 06/11/24  Patient able to perform diaphragmatic breathing to relax her pelvic floor.  Baseline: Goal status: Met 06/10/24  2.  Patient independent with hip strength to stabilize the pelvis.  Baseline:  Goal status: INITIAL   LONG TERM GOALS: Target date: 08/06/24  Patient is independent with advanced HEP for core, pelvic floor and hip strength to reduce continence.  Baseline:  Goal status: INITIAL  2.  Patient is able to go through the day from 5-7 PM without urinary leakage due to increased endurance of the pelvic floor muscles. Baseline:  Goal status: INITIAL  3.  Patient is able to coughing and sneezing without leaking due to the ability to perform the [facility] to prepare the pelvic floor for the activity.  Baseline:  Goal status: INITIAL  4.  Patient is able to contract the pelvic floor and lower abdominals correctly to reduce pressure on the pelvic floor and reduce urinary leakage. Baseline:  Goal status: INITIAL  PLAN:  PT FREQUENCY: 1-2x/week  PT DURATION: 12 weeks  PLANNED INTERVENTIONS: 97110-Therapeutic exercises, 97530- Therapeutic activity, 97112- Neuromuscular re-education, 97535- Self Care, [id number]- Manual therapy, and Patient/Family education; biofeedback; joint mobilization  PLAN FOR NEXT SESSION:  floor, hip stretches, core and hip strength   Channing Pereyra, PT 06/10/24 4:56 PM

## 2024-06-17 ENCOUNTER — Encounter: Payer: Self-pay | Admitting: Physical Therapy

## 2024-06-17 ENCOUNTER — Ambulatory Visit: Payer: Self-pay | Admitting: Physical Therapy

## 2024-06-17 DIAGNOSIS — M6281 Muscle weakness (generalized): Secondary | ICD-10-CM

## 2024-06-17 DIAGNOSIS — M25551 Pain in right hip: Secondary | ICD-10-CM

## 2024-06-17 DIAGNOSIS — R278 Other lack of coordination: Secondary | ICD-10-CM

## 2024-06-17 NOTE — Therapy (Signed)
 OUTPATIENT PHYSICAL THERAPY FEMALE PELVIC TREATMENT   Patient Name: Allison Webb MRN: [id number] DOB:December 27, 1950, 73 y.o., female Today's Date: 06/17/2024  END OF SESSION:  PT End of Session - 06/17/24 1149     Visit Number 3    Date for Recertification  08/06/24    Authorization Type medicare    Authorization - Visit Number 3    Authorization - Number of Visits 10    PT Start Time 1150    PT Stop Time 1230    PT Time Calculation (min) 40 min    Activity Tolerance Patient tolerated treatment well    Behavior During Therapy WFL for tasks assessed/performed          Past Medical History:  Diagnosis Date   Asthma    Pt states she does not have asathma   Eczema    Endometriosis    Hyperlipidemia    Skin cancer    Past Surgical History:  Procedure Laterality Date   ANKLE SURGERY     right ankle   APPENDECTOMY     KNEE SURGERY     right knee   LAPAROTOMY     Patient Active Problem List   Diagnosis Date Noted   History of food allergy  04/24/2017   Allergic reaction 04/24/2017   Cough, persistent 05/17/2014   Chest pain 01/18/2011   Hyperlipidemia 01/18/2011   Chronic rhinitis 09/09/2007   ACUTE BRONCHOSPASM 09/09/2007   Endometriosis 09/09/2007   SKIN CANCER, HX OF 09/09/2007    PCP: Okey Carlin Redbird, MD  REFERRING PROVIDER: Okey Carlin Redbird, MD   REFERRING DIAG: N39.3 (ICD-10-CM) - Stress incontinence (female) (female)   THERAPY DIAG:  Muscle weakness (generalized)  Other lack of coordination  Pain in right hip  Rationale for Evaluation and Treatment: Rehabilitation  ONSET DATE: 04/2024  SUBJECTIVE:                                                                                                                                                                                           SUBJECTIVE STATEMENT: I had a little urinary leakage. I will leak in the evening hours. Happens 2 times per night. I am sorer after the exercises. I am building  back up to running.    Fluid intake: water, coffee, soda,   FUNCTIONAL LIMITATIONS: leak urine  PERTINENT HISTORY:  Medications for current condition: none Surgeries: Appendectomy; Laparotomy Other: Endometriosis; Skin Cancer Sexual abuse: No  PAIN:  Are you having pain? No  PRECAUTIONS: Other: skin cancer  RED FLAGS: None   WEIGHT BEARING RESTRICTIONS: No  FALLS:  Has patient fallen in last 6 months? Yes.  Number of falls hurrying on steps and grabbed doorframe, not due to balance  OCCUPATION: House wife  ACTIVITY LEVEL : tennis 2 times per week  PLOF: Independent  PATIENT GOALS: not leak urine, check right hip   URINATION: Pain with urination: No Fully empty bladder: Nosometimes has to double void                                         Post-void dribble: No Stream: Strong Urgency: No Frequency:during the day every 2-3 hours                                                        Nocturia: No   Leakage: Coughing and Sneezing; when leaks it will spit a small amount of urine Pads/briefs: No  INTERCOURSE:  Ability to have vaginal penetration Yes  Pain with intercourse: none  PREGNANCY: Vaginal deliveries 1 Tearing Yes:    PROLAPSE: Bulge   OBJECTIVE:  Note: Objective measures were completed at Evaluation unless otherwise noted.   PATIENT SURVEYS:  PFIQ-7: 11 UIQ-7 14   COGNITION: Overall cognitive status: Within functional limits for tasks assessed     SENSATION: Light touch: Appears intact    FUNCTIONAL TESTS:  Single leg stance:  Rt: increased trunk sway for 3 sec Squat: decreased lumbar extension  GAIT: Assistive device utilized: None   POSTURE: rounded shoulders, forward head, and decreased lumbar lordosis   LUMBARAROM/PROM: decreased by 25%    LOWER EXTREMITY ROM: bilateral hip ROM is full   LOWER EXTREMITY MMT:  MMT Right eval Left eval  Hip flexion 4/5 5/5  Hip extension 4/5 4/5  Hip abduction 2+/5 4/5   (Blank  rows = not tested) PALPATION:   Pelvic Alignment: ASIS are equal  Abdominal: contract the abdominals with slight bulge of the lower abdominals.   Diastasis: No Distortion: No  Breathing: upper rib cage breath Scar tissue: No                External Perineal Exam: tenderness located on the medial ischiotuberosity on the right                             Internal Pelvic Floor: tenderness located on the alcock's canal, when contracting the pelvic floor there is a slight bulge., tightness in the posterior vaginal canal  Patient confirms identification and approves PT to assess internal pelvic floor and treatment Yes All internal or external pelvic floor assessments and/or treatments are completed with proper hand hygiene and gloves hands. If needed gloves are changed with hand hygiene during patient care time. No emotional/communication barriers or cognitive limitation. Patient is motivated to learn. Patient understands and agrees with treatment goals and plan. PT explains patient will be examined in standing, sitting, and lying down to see how their muscles and joints work. When they are ready, they will be asked to remove their underwear so PT can examine their perineum. The patient is also given the option of providing their own chaperone as one is not provided in our facility. The patient also has the right and is explained the right to defer or refuse any part of the evaluation or treatment  including the internal exam. With the patient's consent, PT will use one gloved finger to gently assess the muscles of the pelvic floor, seeing how well it contracts and relaxes and if there is muscle symmetry. After, the patient will get dressed and PT and patient will discuss exam findings and plan of care. PT and patient discuss plan of care, schedule, attendance policy and HEP activities.   PELVIC MMT:   MMT eval 06/10/24  Vaginal 4/5 5/5  (Blank rows = not tested)        TONE: Increased on the  right  PROLAPSE: none  TODAY'S TREATMENT:     06/17/24 Neuromuscular re-education: Core facilitation: Supine with pelvis on the disc contracting the transverse abdominus 15 x Supine marching with core engaged and pelvis on disc 15 x each leg Laying on side hip abduction but hurt the right hip 5 x each side Standing hip abduction without band 10 x each  Standing hip abduction with red band 15 x  each Standing hip extension 15 x each  Standing transverse abdominus contraction 10 x  Down training: Sitting on foam roll to massage the pelvic floor Supine diaphragmatic breathing Self-care: Educated patient on vaginal moisturizers to keep the moisture in the tissue, cleaning the tissue, not using soapkkkkkkkkkkkkkkkkkkk    06/10/24 Manual: Internal pelvic floor techniques: No emotional/communication barriers or cognitive limitation. Patient is motivated to learn. Patient understands and agrees with treatment goals and plan. PT explains patient will be examined in standing, sitting, and lying down to see how their muscles and joints work. When they are ready, they will be asked to remove their underwear so PT can examine their perineum. The patient is also given the option of providing their own chaperone as one is not provided in our facility. The patient also has the right and is explained the right to defer or refuse any part of the evaluation or treatment including the internal exam. With the patient's consent, PT will use one gloved finger to gently assess the muscles of the pelvic floor, seeing how well it contracts and relaxes and if there is muscle symmetry. After, the patient will get dressed and PT and patient will discuss exam findings and plan of care. PT and patient discuss plan of care, schedule, attendance policy and HEP activities.  Therapist finger in the vaginal canal working on the levator ani and around the alcock's canal to lengthen the tissue Neuromuscular re-education: Core  retraining: Supine contracting the transverse abdominus with hands on the lower abdomen using tactile cue 20 x Laying on side hip abduction with core engaged and tactile cues to bring leg up and not forward 10 x 2 Supine marching with core engaged 20 x  Quadruped lift opposite arm and leg but not able to do that Quadruped lift left with cues to keep pelvis balanced 5 x but arms were tired Leaning on counter and lift leg 10 x each side Down training: Diaphragmatic breathing in supine 20 x with tactile cues and verbal cues  DATE: 05/14/24  EVAL Examination completed, findings reviewed, pt educated on POC, HEP, and female pelvic floor anatomy, reasoning with pelvic floor assessment internally with pt consent. Pt motivated to participate in PT and agreeable to attempt recommendations.     PATIENT EDUCATION:  06/17/24 Education details: Access Code: [email or web address] Person educated: Patient Education method: Explanation, Demonstration, Tactile cues, Verbal cues, and Handouts Education comprehension: verbalized understanding, returned demonstration, verbal cues required, tactile cues required, and needs further education  HOME EXERCISE PROGRAM: 06/17/24 Access Code: [email or web address] URL: [email or web address] Date: 06/17/2024 Prepared by: Channing Pereyra  Exercises - Seated Diaphragmatic Breathing  - 1 x daily - 7 x weekly - 1 sets - 10 reps - Hooklying Transversus Abdominis Palpation  - 3 x daily - 7 x weekly - 1 sets - 10 reps - Supine March  - 1 x daily - 3 x weekly - 2 sets - 10 reps - Prone Hip Extension on Table  - 1 x daily - 3 x weekly - 2 sets - 10 reps - Standing Hip Abduction with Resistance at Ankles and Counter Support  - 1 x daily - 3 x weekly - 1 sets - 10 reps - Seated Transversus Abdominis Bracing  - 1 x daily - 7 x weekly - 1 sets - 10  reps  ASSESSMENT:  CLINICAL IMPRESSION: Patient is a 73 y.o. female who was seen today for physical therapy  treatment for stress urinary leakage.  She has a few episodes of urinary leakage. Patient has difficulty with contracting the lower abdominals. She needs lots of verbal and tactile cues. She was able to perform the diaphragmatic breathing correctly. She does better with the hip abduction in standing than laying down.  Patient will benefit from skilled therapy to improve pelvic floor coordination strength to reduce the leakage.   OBJECTIVE IMPAIRMENTS: decreased coordination, decreased endurance, and decreased strength.   ACTIVITY LIMITATIONS: continence  PARTICIPATION LIMITATIONS: community activity  PERSONAL FACTORS: 1-2 comorbidities: Endometriosis; Skin Cancer are also affecting patient's functional outcome.   REHAB POTENTIAL: Excellent  CLINICAL DECISION MAKING: Stable/uncomplicated  EVALUATION COMPLEXITY: Low   GOALS: Goals reviewed with patient? Yes  SHORT TERM GOALS: Target date: 06/11/24  Patient able to perform diaphragmatic breathing to relax her pelvic floor.  Baseline: Goal status: Met 06/10/24  2.  Patient independent with hip strength to stabilize the pelvis.  Baseline:  Goal status: INITIAL   LONG TERM GOALS: Target date: 08/06/24  Patient is independent with advanced HEP for core, pelvic floor and hip strength to reduce continence.  Baseline:  Goal status: INITIAL  2.  Patient is able to go through the day from 5-7 PM without urinary leakage due to increased endurance of the pelvic floor muscles. Baseline:  Goal status: INITIAL  3.  Patient is able to coughing and sneezing without leaking due to the ability to perform the [facility] to prepare the pelvic floor for the activity.  Baseline:  Goal status: INITIAL  4.  Patient is able to contract the pelvic floor and lower abdominals correctly to reduce pressure on the pelvic floor and reduce urinary  leakage. Baseline:  Goal status: INITIAL  PLAN:  PT FREQUENCY: 1-2x/week  PT DURATION: 12 weeks  PLANNED INTERVENTIONS: 97110-Therapeutic exercises, 97530- Therapeutic activity, 97112- Neuromuscular re-education, 97535- Self Care, [id number]- Manual therapy, and Patient/Family education; biofeedback; joint mobilization  PLAN FOR NEXT SESSION:   hip stretches, core and hip strength   Channing Pereyra, PT 06/17/24 12:33 PM

## 2024-06-17 NOTE — Patient Instructions (Signed)
 Moisturizers They are used in the vagina to hydrate the mucous membrane that make up the vaginal canal. Designed to keep a more normal acid balance (ph) Once placed in the vagina, it will last between two to three days.  Use 2-3 times per week at bedtime  Ingredients to avoid is glycerin and fragrance, can increase chance of infection Should not be used just before sex due to causing irritation Most are gels administered either in a tampon-shaped applicator or as a vaginal suppository. They are non-hormonal.   Types of Moisturizers(internal use)  Vitamin E vaginal suppositories- Whole foods, Amazon Moist Again Coconut oil- can break down condoms, any grocery store (prefer organic) Julva- (Do no use if taking  Tamoxifen) amazon Yes moisturizer- amazon NeuEve Silk , NeuEve Silver for menopausal or over 65 (if have severe vaginal atrophy or cancer treatments use NeuEve Silk for  1 month than move to [employer])- [employer], [place].com Olive and Bee intimate cream- [email or web address].com.au Mae vaginal moisturizer- Amazon Aloe Good Clean Love Hyaluronic acid Hyalofemme Reveree hyaluronic acid inserts   Creams to use externally on the Vulva area Marathon Oil (good for for cancer patients that had radiation to the area)- [place] or [email or web address]/ Vulva Balm/ V-magic cream by medicine mama- amazon Julva-amazon Vital "V Wild Yam salve ( help moisturize and help with thinning vulvar area, does have Beeswax MoodMaid Botanical Pro-Meno Wild Yam Cream- Amazon Desert Harvest Gele Cleo by Zane Herald labial moisturizer (Amazon),  Coconut or olive oil aloe Good Clean Love Enchanted Rose by intimate rose  Things to avoid in the vaginal area Do not use things to irritate the vulvar area No lotions just specialized creams for the vulva area- Neogyn, V-magic,  No soaps; can use Aveeno or Calendula cleanser, unscented Dove if needed. Must be gentle No deodorants No douches Good to  sleep without underwear to let the vaginal area to air out No scrubbing: spread the lips to let warm water rinse over labias and pat dry   [facility] [address] [place], [place] [address] Phone # [phone] Fax [phone]

## 2024-06-22 ENCOUNTER — Encounter: Payer: Self-pay | Admitting: Physical Therapy

## 2024-06-22 ENCOUNTER — Ambulatory Visit: Payer: Self-pay | Admitting: Physical Therapy

## 2024-06-22 DIAGNOSIS — M6281 Muscle weakness (generalized): Secondary | ICD-10-CM

## 2024-06-22 DIAGNOSIS — M25551 Pain in right hip: Secondary | ICD-10-CM

## 2024-06-22 DIAGNOSIS — R278 Other lack of coordination: Secondary | ICD-10-CM

## 2024-06-22 NOTE — Therapy (Signed)
 OUTPATIENT PHYSICAL THERAPY FEMALE PELVIC TREATMENT   Patient Name: Allison Webb MRN: [id number] DOB:03-Dec-1950, 73 y.o., female Today's Date: 06/22/2024  END OF SESSION:  PT End of Session - 06/22/24 0847     Visit Number 4    Date for Recertification  08/06/24    Authorization Type medicare    Authorization - Visit Number 4    Authorization - Number of Visits 10    PT Start Time 0845    PT Stop Time 0925    PT Time Calculation (min) 40 min    Activity Tolerance Patient tolerated treatment well    Behavior During Therapy [facility] for tasks assessed/performed          Past Medical History:  Diagnosis Date   Asthma    Pt states she does not have asathma   Eczema    Endometriosis    Hyperlipidemia    Skin cancer    Past Surgical History:  Procedure Laterality Date   ANKLE SURGERY     right ankle   APPENDECTOMY     KNEE SURGERY     right knee   LAPAROTOMY     Patient Active Problem List   Diagnosis Date Noted   History of food allergy  04/24/2017   Allergic reaction 04/24/2017   Cough, persistent 05/17/2014   Chest pain 01/18/2011   Hyperlipidemia 01/18/2011   Chronic rhinitis 09/09/2007   ACUTE BRONCHOSPASM 09/09/2007   Endometriosis 09/09/2007   SKIN CANCER, HX OF 09/09/2007    PCP: Okey Carlin Redbird, MD  REFERRING PROVIDER: Okey Carlin Redbird, MD   REFERRING DIAG: N39.3 (ICD-10-CM) - Stress incontinence (female) (female)   THERAPY DIAG:  Muscle weakness (generalized)  Other lack of coordination  Pain in right hip  Rationale for Evaluation and Treatment: Rehabilitation  ONSET DATE: 04/2024  SUBJECTIVE:                                                                                                                                                                                           SUBJECTIVE STATEMENT: I have not leaked at all this week. I have felt some pressure. Patient has not double voided recently. I am urinating multiple times to make  sure I have no urine left in the bladder when out in public places.  Fluid intake: water, coffee, soda,   FUNCTIONAL LIMITATIONS: leak urine  PERTINENT HISTORY:  Medications for current condition: none Surgeries: Appendectomy; Laparotomy Other: Endometriosis; Skin Cancer Sexual abuse: No  PAIN:  Are you having pain? No  PRECAUTIONS: Other: skin cancer  RED FLAGS: None   WEIGHT BEARING RESTRICTIONS: No  FALLS:  Has  patient fallen in last 6 months? Yes. Number of falls hurrying on steps and grabbed doorframe, not due to balance  OCCUPATION: House wife  ACTIVITY LEVEL : tennis 2 times per week  PLOF: Independent  PATIENT GOALS: not leak urine, check right hip   URINATION: Pain with urination: No Fully empty bladder: No sometimes has to double void                                         Post-void dribble: No Stream: Strong Urgency: No Frequency:during the day every 2-3 hours                                                        Nocturia: No   Leakage: Coughing and Sneezing; when leaks it will spit a small amount of urine Pads/briefs: No  INTERCOURSE:  Ability to have vaginal penetration Yes  Pain with intercourse: none  PREGNANCY: Vaginal deliveries 1 Tearing Yes:    PROLAPSE: Bulge   OBJECTIVE:  Note: Objective measures were completed at Evaluation unless otherwise noted.   PATIENT SURVEYS:  PFIQ-7: 11 UIQ-7 14   COGNITION: Overall cognitive status: Within functional limits for tasks assessed     SENSATION: Light touch: Appears intact    FUNCTIONAL TESTS:  Single leg stance:  Rt: increased trunk sway for 3 sec Squat: decreased lumbar extension  GAIT: Assistive device utilized: None   POSTURE: rounded shoulders, forward head, and decreased lumbar lordosis   LUMBARAROM/PROM: decreased by 25%    LOWER EXTREMITY ROM: bilateral hip ROM is full   LOWER EXTREMITY MMT:  MMT Right eval Left eval  Hip flexion 4/5 5/5  Hip  extension 4/5 4/5  Hip abduction 2+/5 4/5   (Blank rows = not tested) PALPATION:   Pelvic Alignment: ASIS are equal  Abdominal: contract the abdominals with slight bulge of the lower abdominals.   Diastasis: No Distortion: No  Breathing: upper rib cage breath Scar tissue: No                External Perineal Exam: tenderness located on the medial ischiotuberosity on the right                             Internal Pelvic Floor: tenderness located on the alcock's canal, when contracting the pelvic floor there is a slight bulge., tightness in the posterior vaginal canal  Patient confirms identification and approves PT to assess internal pelvic floor and treatment Yes All internal or external pelvic floor assessments and/or treatments are completed with proper hand hygiene and gloves hands. If needed gloves are changed with hand hygiene during patient care time. No emotional/communication barriers or cognitive limitation. Patient is motivated to learn. Patient understands and agrees with treatment goals and plan. PT explains patient will be examined in standing, sitting, and lying down to see how their muscles and joints work. When they are ready, they will be asked to remove their underwear so PT can examine their perineum. The patient is also given the option of providing their own chaperone as one is not provided in our facility. The patient also has the right and is explained the right to defer or  refuse any part of the evaluation or treatment including the internal exam. With the patient's consent, PT will use one gloved finger to gently assess the muscles of the pelvic floor, seeing how well it contracts and relaxes and if there is muscle symmetry. After, the patient will get dressed and PT and patient will discuss exam findings and plan of care. PT and patient discuss plan of care, schedule, attendance policy and HEP activities.   PELVIC MMT:   MMT eval 06/10/24  Vaginal 4/5 5/5  (Blank  rows = not tested)        TONE: Increased on the right  PROLAPSE: none  TODAY'S TREATMENT:     06/22/24 Self-care: Educated patient on not urinating until she has the urge to urinate. Educated her on how the she is training her bladder to urinate without the urge. Patient understands she is to wait for the urge to go to the bathroom and there is a little bit of urine that can remain in the bladder.  Neuromuscular re-education: Core retraining: Transverse abdominus contraction without lifting the pelvis 20 x and needed tactile cues Supine marching with concentration of contraction of the lower abdomen 5 x 2 each side Bridge 10 x  One legged bridge 5 x 2 each  Leaning on counter lift opposite arm and leg 5 x 3 Standing hip abduction 3 x 5 with resistance    06/17/24 Neuromuscular re-education: Core facilitation: Supine with pelvis on the disc contracting the transverse abdominus 15 x Supine marching with core engaged and pelvis on disc 15 x each leg Laying on side hip abduction but hurt the right hip 5 x each side Standing hip abduction without band 10 x each  Standing hip abduction with red band 15 x  each Standing hip extension 15 x each  Standing transverse abdominus contraction 10 x  Down training: Sitting on foam roll to massage the pelvic floor Supine diaphragmatic breathing Self-care: Educated patient on vaginal moisturizers to keep the moisture in the tissue, cleaning the tissue, not using soap    06/10/24 Manual: Internal pelvic floor techniques: No emotional/communication barriers or cognitive limitation. Patient is motivated to learn. Patient understands and agrees with treatment goals and plan. PT explains patient will be examined in standing, sitting, and lying down to see how their muscles and joints work. When they are ready, they will be asked to remove their underwear so PT can examine their perineum. The patient is also given the option of providing their own  chaperone as one is not provided in our facility. The patient also has the right and is explained the right to defer or refuse any part of the evaluation or treatment including the internal exam. With the patient's consent, PT will use one gloved finger to gently assess the muscles of the pelvic floor, seeing how well it contracts and relaxes and if there is muscle symmetry. After, the patient will get dressed and PT and patient will discuss exam findings and plan of care. PT and patient discuss plan of care, schedule, attendance policy and HEP activities.  Therapist finger in the vaginal canal working on the levator ani and around the alcock's canal to lengthen the tissue Neuromuscular re-education: Core retraining: Supine contracting the transverse abdominus with hands on the lower abdomen using tactile cue 20 x Laying on side hip abduction with core engaged and tactile cues to bring leg up and not forward 10 x 2 Supine marching with core engaged 20 x  Quadruped lift  opposite arm and leg but not able to do that Quadruped lift left with cues to keep pelvis balanced 5 x but arms were tired Leaning on counter and lift leg 10 x each side Down training: Diaphragmatic breathing in supine 20 x with tactile cues and verbal cues      PATIENT EDUCATION:  06/22/24 Education details: Access Code: [email or web address] Person educated: Patient Education method: Explanation, Demonstration, Tactile cues, Verbal cues, and Handouts Education comprehension: verbalized understanding, returned demonstration, verbal cues required, tactile cues required, and needs further education  HOME EXERCISE PROGRAM: 06/22/24 Access Code: [email or web address] URL: [email or web address] Date: 06/22/2024 Prepared by: Channing Pereyra  Exercises - Seated Diaphragmatic Breathing  - 1 x daily - 7 x weekly - 1 sets - 10 reps - Hooklying Transversus Abdominis Palpation  - 3 x daily - 7 x weekly - 1 sets - 10 reps - Supine March  - 1 x  daily - 3 x weekly - 2 sets - 10 reps - Standing Hip Abduction with Resistance at Ankles and Counter Support  - 1 x daily - 3 x weekly - 3 sets - 5 reps - Seated Transversus Abdominis Bracing  - 1 x daily - 7 x weekly - 1 sets - 10 reps - Single Leg Bridge  - 1 x daily - 2 x weekly - 2 sets - 2 reps - Bird Dog on Counter  - 1 x daily - 2 x weekly - 3 sets - 5 reps  ASSESSMENT:  CLINICAL IMPRESSION: Patient is a 73 y.o. female who was seen today for physical therapy  treatment for stress urinary leakage.  She has had no  episodes of urinary leakage since last visit.  Patient is not double voiding. She will urinate when she does not have the urge to urinate to avoid possible urination at an inappropriate time. She still needs verbal and tactile cues to contract her abdominals correctly with her exercise. Patient will benefit from skilled therapy to improve pelvic floor coordination strength to reduce the leakage.   OBJECTIVE IMPAIRMENTS: decreased coordination, decreased endurance, and decreased strength.   ACTIVITY LIMITATIONS: continence  PARTICIPATION LIMITATIONS: community activity  PERSONAL FACTORS: 1-2 comorbidities: Endometriosis; Skin Cancer are also affecting patient's functional outcome.   REHAB POTENTIAL: Excellent  CLINICAL DECISION MAKING: Stable/uncomplicated  EVALUATION COMPLEXITY: Low   GOALS: Goals reviewed with patient? Yes  SHORT TERM GOALS: Target date: 06/11/24  Patient able to perform diaphragmatic breathing to relax her pelvic floor.  Baseline: Goal status: Met 06/10/24  2.  Patient independent with hip strength to stabilize the pelvis.  Baseline:  Goal status: Met 06/22/24   LONG TERM GOALS: Target date: 08/06/24  Patient is independent with advanced HEP for core, pelvic floor and hip strength to reduce continence.  Baseline:  Goal status: INITIAL  2.  Patient is able to go through the day from 5-7 PM without urinary leakage due to increased endurance  of the pelvic floor muscles. Baseline:  Goal status: INITIAL  3.  Patient is able to coughing and sneezing without leaking due to the ability to perform the [facility] to prepare the pelvic floor for the activity.  Baseline:  Goal status: INITIAL  4.  Patient is able to contract the pelvic floor and lower abdominals correctly to reduce pressure on the pelvic floor and reduce urinary leakage. Baseline:  Goal status: INITIAL  PLAN:  PT FREQUENCY: 1-2x/week  PT DURATION: 12 weeks  PLANNED INTERVENTIONS: 97110-Therapeutic exercises, 97530- Therapeutic activity, [id number]- Neuromuscular re-education, 97535- Self  Care, [id number]- Manual therapy, and Patient/Family education; biofeedback; joint mobilization  PLAN FOR NEXT SESSION:   hip stretches, core and hip strength   Channing Pereyra, PT 06/22/2024 9:29 AM

## 2024-06-29 ENCOUNTER — Ambulatory Visit: Admitting: Physical Therapy

## 2024-06-29 ENCOUNTER — Encounter: Payer: Self-pay | Admitting: Physical Therapy

## 2024-06-29 DIAGNOSIS — M6281 Muscle weakness (generalized): Secondary | ICD-10-CM

## 2024-06-29 DIAGNOSIS — M25551 Pain in right hip: Secondary | ICD-10-CM

## 2024-06-29 DIAGNOSIS — R278 Other lack of coordination: Secondary | ICD-10-CM

## 2024-06-29 NOTE — Therapy (Signed)
 " OUTPATIENT PHYSICAL THERAPY FEMALE PELVIC TREATMENT   Patient Name: Allison Webb MRN: [id number] DOB:Nov 30, 1950, 73 y.o., female Today's Date: 06/29/2024  END OF SESSION:  PT End of Session - 06/29/24 1149     Visit Number 5    Date for Recertification  08/06/24    Authorization Type medicare    Authorization - Visit Number 5    Authorization - Number of Visits 10    PT Start Time 1145    PT Stop Time 1225    PT Time Calculation (min) 40 min    Activity Tolerance Patient tolerated treatment well    Behavior During Therapy WFL for tasks assessed/performed          Past Medical History:  Diagnosis Date   Asthma    Pt states she does not have asathma   Eczema    Endometriosis    Hyperlipidemia    Skin cancer    Past Surgical History:  Procedure Laterality Date   ANKLE SURGERY     right ankle   APPENDECTOMY     KNEE SURGERY     right knee   LAPAROTOMY     Patient Active Problem List   Diagnosis Date Noted   History of food allergy  04/24/2017   Allergic reaction 04/24/2017   Cough, persistent 05/17/2014   Chest pain 01/18/2011   Hyperlipidemia 01/18/2011   Chronic rhinitis 09/09/2007   ACUTE BRONCHOSPASM 09/09/2007   Endometriosis 09/09/2007   SKIN CANCER, HX OF 09/09/2007    PCP: Okey Carlin Redbird, MD  REFERRING PROVIDER: Okey Carlin Redbird, MD   REFERRING DIAG: N39.3 (ICD-10-CM) - Stress incontinence (female) (female)   THERAPY DIAG:  Muscle weakness (generalized)  Other lack of coordination  Pain in right hip  Rationale for Evaluation and Treatment: Rehabilitation  ONSET DATE: 04/2024  SUBJECTIVE:                                                                                                                                                                                           SUBJECTIVE STATEMENT: I have been working on not going to the bathroom as much. Leaked while sitting and leaked a small amount.   Fluid intake: water, coffee,  soda,   FUNCTIONAL LIMITATIONS: leak urine  PERTINENT HISTORY:  Medications for current condition: none Surgeries: Appendectomy; Laparotomy Other: Endometriosis; Skin Cancer Sexual abuse: No  PAIN:  Are you having pain? No  PRECAUTIONS: Other: skin cancer  RED FLAGS: None   WEIGHT BEARING RESTRICTIONS: No  FALLS:  Has patient fallen in last 6 months? Yes. Number of falls hurrying on steps and grabbed doorframe, not due  to balance  OCCUPATION: House wife  ACTIVITY LEVEL : tennis 2 times per week  PLOF: Independent  PATIENT GOALS: not leak urine, check right hip   URINATION: Pain with urination: No Fully empty bladder: No sometimes has to double void                                         Post-void dribble: No Stream: Strong Urgency: No Frequency:during the day every 2-3 hours                                                        Nocturia: No   Leakage: Coughing and Sneezing; when leaks it will spit a small amount of urine Pads/briefs: No  INTERCOURSE:  Ability to have vaginal penetration Yes  Pain with intercourse: none  PREGNANCY: Vaginal deliveries 1 Tearing Yes:    PROLAPSE: Bulge   OBJECTIVE:  Note: Objective measures were completed at Evaluation unless otherwise noted.   PATIENT SURVEYS:  PFIQ-7: 11 UIQ-7 14   COGNITION: Overall cognitive status: Within functional limits for tasks assessed     SENSATION: Light touch: Appears intact    FUNCTIONAL TESTS:  Single leg stance:  Rt: increased trunk sway for 3 sec Squat: decreased lumbar extension  GAIT: Assistive device utilized: None   POSTURE: rounded shoulders, forward head, and decreased lumbar lordosis   LUMBARAROM/PROM: decreased by 25%    LOWER EXTREMITY ROM: bilateral hip ROM is full   LOWER EXTREMITY MMT:  MMT Right eval Left eval  Hip flexion 4/5 5/5  Hip extension 4/5 4/5  Hip abduction 2+/5 4/5   (Blank rows = not tested) PALPATION:   Pelvic Alignment:  ASIS are equal  Abdominal: contract the abdominals with slight bulge of the lower abdominals.   Diastasis: No Distortion: No  Breathing: upper rib cage breath Scar tissue: No                External Perineal Exam: tenderness located on the medial ischiotuberosity on the right                             Internal Pelvic Floor: tenderness located on the alcock's canal, when contracting the pelvic floor there is a slight bulge., tightness in the posterior vaginal canal  Patient confirms identification and approves PT to assess internal pelvic floor and treatment Yes All internal or external pelvic floor assessments and/or treatments are completed with proper hand hygiene and gloves hands. If needed gloves are changed with hand hygiene during patient care time. No emotional/communication barriers or cognitive limitation. Patient is motivated to learn. Patient understands and agrees with treatment goals and plan. PT explains patient will be examined in standing, sitting, and lying down to see how their muscles and joints work. When they are ready, they will be asked to remove their underwear so PT can examine their perineum. The patient is also given the option of providing their own chaperone as one is not provided in our facility. The patient also has the right and is explained the right to defer or refuse any part of the evaluation or treatment including the internal exam. With the patient's consent, PT will  use one gloved finger to gently assess the muscles of the pelvic floor, seeing how well it contracts and relaxes and if there is muscle symmetry. After, the patient will get dressed and PT and patient will discuss exam findings and plan of care. PT and patient discuss plan of care, schedule, attendance policy and HEP activities.   PELVIC MMT:   MMT eval 06/10/24  Vaginal 4/5 5/5  (Blank rows = not tested)        TONE: Increased on the right  PROLAPSE: none  TODAY'S TREATMENT:      06/29/24 Neuromuscular re-education: Core facilitation: Supine marching with transverse abdominus contraction Supine alterate hip and shoulder flexion 20 x  One legged bridge with bilateral shoulder flexion 10 x 2 Laying on side hip adduction 10 x 2 each side Pelvic floor contraction training: Sitting on ball moving side to side, pelvic circles each way, and diagonals to feel the pelvic floor contract different ways Sit to stand from ball and mat with increased hip flexion and anterior tilt of the pelvis Self-care: Educated patient on not bearing down to fully empty her bladder and just leg it come out.     06/22/24 Self-care: Educated patient on not urinating until she has the urge to urinate. Educated her on how the she is training her bladder to urinate without the urge. Patient understands she is to wait for the urge to go to the bathroom and there is a little bit of urine that can remain in the bladder.  Neuromuscular re-education: Core retraining: Transverse abdominus contraction without lifting the pelvis 20 x and needed tactile cues Supine marching with concentration of contraction of the lower abdomen 5 x 2 each side Bridge 10 x  One legged bridge 5 x 2 each  Leaning on counter lift opposite arm and leg 5 x 3 Standing hip abduction 3 x 5 with resistance    06/17/24 Neuromuscular re-education: Core facilitation: Supine with pelvis on the disc contracting the transverse abdominus 15 x Supine marching with core engaged and pelvis on disc 15 x each leg Laying on side hip abduction but hurt the right hip 5 x each side Standing hip abduction without band 10 x each  Standing hip abduction with red band 15 x  each Standing hip extension 15 x each  Standing transverse abdominus contraction 10 x  Down training: Sitting on foam roll to massage the pelvic floor Supine diaphragmatic breathing Self-care: Educated patient on vaginal moisturizers to keep the moisture in the  tissue, cleaning the tissue, not using soap    PATIENT EDUCATION:  06/22/24 Education details: Access Code: [email or web address] Person educated: Patient Education method: Explanation, Demonstration, Tactile cues, Verbal cues, and Handouts Education comprehension: verbalized understanding, returned demonstration, verbal cues required, tactile cues required, and needs further education  HOME EXERCISE PROGRAM: 06/22/24 Access Code: [email or web address] URL: [email or web address] Date: 06/22/2024 Prepared by: Channing Pereyra  Exercises - Seated Diaphragmatic Breathing  - 1 x daily - 7 x weekly - 1 sets - 10 reps - Hooklying Transversus Abdominis Palpation  - 3 x daily - 7 x weekly - 1 sets - 10 reps - Supine March  - 1 x daily - 3 x weekly - 2 sets - 10 reps - Standing Hip Abduction with Resistance at Ankles and Counter Support  - 1 x daily - 3 x weekly - 3 sets - 5 reps - Seated Transversus Abdominis Bracing  - 1 x daily - 7 x weekly - 1 sets - 10 reps -  Single Leg Bridge  - 1 x daily - 2 x weekly - 2 sets - 2 reps - Bird Dog on Counter  - 1 x daily - 2 x weekly - 3 sets - 5 reps  ASSESSMENT:  CLINICAL IMPRESSION: Patient is a 73 y.o. female who was seen today for physical therapy  treatment for stress urinary leakage.  She has had one  episodes of urinary leakage since last visit.  Patient is posteriorly tilting with getting up and down from a chair and is learning how to anterior tilt the pelvis for the  pelvic floor to contract correctly. She has not leaked with coughing or sneezing in 2 weeks.  Patient will benefit from skilled therapy to improve pelvic floor coordination strength to reduce the leakage.   OBJECTIVE IMPAIRMENTS: decreased coordination, decreased endurance, and decreased strength.   ACTIVITY LIMITATIONS: continence  PARTICIPATION LIMITATIONS: community activity  PERSONAL FACTORS: 1-2 comorbidities: Endometriosis; Skin Cancer are also affecting patient's functional outcome.    REHAB POTENTIAL: Excellent  CLINICAL DECISION MAKING: Stable/uncomplicated  EVALUATION COMPLEXITY: Low   GOALS: Goals reviewed with patient? Yes  SHORT TERM GOALS: Target date: 06/11/24  Patient able to perform diaphragmatic breathing to relax her pelvic floor.  Baseline: Goal status: Met 06/10/24  2.  Patient independent with hip strength to stabilize the pelvis.  Baseline:  Goal status: Met 06/22/24   LONG TERM GOALS: Target date: 08/06/24  Patient is independent with advanced HEP for core, pelvic floor and hip strength to reduce continence.  Baseline:  Goal status: INITIAL  2.  Patient is able to go through the day from 5-7 PM without urinary leakage due to increased endurance of the pelvic floor muscles. Baseline:  Goal status: INITIAL  3.  Patient is able to coughing and sneezing without leaking due to the ability to perform the [facility] to prepare the pelvic floor for the activity.  Baseline:  Goal status: INITIAL  4.  Patient is able to contract the pelvic floor and lower abdominals correctly to reduce pressure on the pelvic floor and reduce urinary leakage. Baseline:  Goal status: INITIAL  PLAN:  PT FREQUENCY: 1-2x/week  PT DURATION: 12 weeks  PLANNED INTERVENTIONS: 97110-Therapeutic exercises, 97530- Therapeutic activity, 97112- Neuromuscular re-education, 97535- Self Care, [id number]- Manual therapy, and Patient/Family education; biofeedback; joint mobilization  PLAN FOR NEXT SESSION:   hip stretches, core and hip strength   Channing Pereyra, PT 06/29/2024 12:27 PM  "

## 2024-07-06 ENCOUNTER — Encounter: Payer: Self-pay | Admitting: Physical Therapy

## 2024-07-06 ENCOUNTER — Ambulatory Visit: Admitting: Physical Therapy

## 2024-07-06 DIAGNOSIS — R278 Other lack of coordination: Secondary | ICD-10-CM

## 2024-07-06 DIAGNOSIS — M25551 Pain in right hip: Secondary | ICD-10-CM

## 2024-07-06 DIAGNOSIS — M6281 Muscle weakness (generalized): Secondary | ICD-10-CM | POA: Diagnosis not present

## 2024-07-06 NOTE — Therapy (Signed)
 " OUTPATIENT PHYSICAL THERAPY FEMALE PELVIC TREATMENT   Patient Name: Allison Webb MRN: [id number] DOB:06-07-1951, 73 y.o., female Today's Date: 07/06/2024  END OF SESSION:  PT End of Session - 07/06/24 1148     Visit Number 6    Date for Recertification  08/06/24    Authorization Type medicare    Authorization - Visit Number 6    Authorization - Number of Visits 10    PT Start Time 1145    PT Stop Time 1225    PT Time Calculation (min) 40 min    Activity Tolerance Patient tolerated treatment well    Behavior During Therapy WFL for tasks assessed/performed          Past Medical History:  Diagnosis Date   Asthma    Pt states she does not have asathma   Eczema    Endometriosis    Hyperlipidemia    Skin cancer    Past Surgical History:  Procedure Laterality Date   ANKLE SURGERY     right ankle   APPENDECTOMY     KNEE SURGERY     right knee   LAPAROTOMY     Patient Active Problem List   Diagnosis Date Noted   History of food allergy  04/24/2017   Allergic reaction 04/24/2017   Cough, persistent 05/17/2014   Chest pain 01/18/2011   Hyperlipidemia 01/18/2011   Chronic rhinitis 09/09/2007   ACUTE BRONCHOSPASM 09/09/2007   Endometriosis 09/09/2007   SKIN CANCER, HX OF 09/09/2007    PCP: Okey Carlin Redbird, MD  REFERRING PROVIDER: Okey Carlin Redbird, MD   REFERRING DIAG: N39.3 (ICD-10-CM) - Stress incontinence (female) (female)   THERAPY DIAG:  Muscle weakness (generalized)  Other lack of coordination  Pain in right hip  Rationale for Evaluation and Treatment: Rehabilitation  ONSET DATE: 04/2024  SUBJECTIVE:                                                                                                                                                                                           SUBJECTIVE STATEMENT: It has been good to not push out the urine. My abdominal muscles are stronger. I cough with my legs up and leaked urine.  Fluid intake:  water, coffee, soda,   FUNCTIONAL LIMITATIONS: leak urine  PERTINENT HISTORY:  Medications for current condition: none Surgeries: Appendectomy; Laparotomy Other: Endometriosis; Skin Cancer Sexual abuse: No  PAIN:  Are you having pain? No  PRECAUTIONS: Other: skin cancer  RED FLAGS: None   WEIGHT BEARING RESTRICTIONS: No  FALLS:  Has patient fallen in last 6 months? Yes. 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 LEVEL : tennis 2 times per week  PLOF: Independent  PATIENT GOALS: not leak urine, check right hip   URINATION: Pain with urination: No Fully empty bladder: No sometimes has to double void                                         Post-void dribble: No Stream: Strong Urgency: No Frequency:during the day every 2-3 hours                                                        Nocturia: No   Leakage: Coughing and Sneezing; when leaks it will spit a small amount of urine Pads/briefs: No  INTERCOURSE:  Ability to have vaginal penetration Yes  Pain with intercourse: none  PREGNANCY: Vaginal deliveries 1 Tearing Yes:    PROLAPSE: Bulge   OBJECTIVE:  Note: Objective measures were completed at Evaluation unless otherwise noted.   PATIENT SURVEYS:  PFIQ-7: 11 UIQ-7 14   COGNITION: Overall cognitive status: Within functional limits for tasks assessed     SENSATION: Light touch: Appears intact    FUNCTIONAL TESTS:  Single leg stance:  Rt: increased trunk sway for 3 sec Squat: decreased lumbar extension  GAIT: Assistive device utilized: None   POSTURE: rounded shoulders, forward head, and decreased lumbar lordosis   LUMBARAROM/PROM: decreased by 25%    LOWER EXTREMITY ROM: bilateral hip ROM is full   LOWER EXTREMITY MMT:  MMT Right eval Left eval  Hip flexion 4/5 5/5  Hip extension 4/5 4/5  Hip abduction 2+/5 4/5   (Blank rows = not  tested) PALPATION:   Pelvic Alignment: ASIS are equal  Abdominal: contract the abdominals with slight bulge of the lower abdominals.   Diastasis: No Distortion: No  Breathing: upper rib cage breath Scar tissue: No                External Perineal Exam: tenderness located on the medial ischiotuberosity on the right                             Internal Pelvic Floor: tenderness located on the alcock's canal, when contracting the pelvic floor there is a slight bulge., tightness in the posterior vaginal canal  Patient confirms identification and approves PT to assess internal pelvic floor and treatment Yes All internal or external pelvic floor assessments and/or treatments are completed with proper hand hygiene and gloves hands. If needed gloves are changed with hand hygiene during patient care time. No emotional/communication barriers or cognitive limitation. Patient is motivated to learn. Patient understands and agrees with treatment goals and plan. PT explains patient will be examined in standing, sitting, and lying down to see how their muscles and joints work. When they are ready, they will be asked to remove their underwear so PT can examine their perineum. The patient is also given the option of providing their own chaperone as one is not provided in our facility. The patient also has the right and is explained the right to defer or refuse any part of the evaluation or treatment including the internal exam. With the patient's  consent, PT will use one gloved finger to gently assess the muscles of the pelvic floor, seeing how well it contracts and relaxes and if there is muscle symmetry. After, the patient will get dressed and PT and patient will discuss exam findings and plan of care. PT and patient discuss plan of care, schedule, attendance policy and HEP activities.   PELVIC MMT:   MMT eval 06/10/24  Vaginal 4/5 5/5  (Blank rows = not tested)        TONE: Increased on the  right  PROLAPSE: none  TODAY'S TREATMENT:     07/06/24 Neuromuscular re-education: Pelvic floor contraction training: Sitting on ball moving side to side, pelvic circles each way, and diagonals to feel the pelvic floor contract different ways Supine with feet on ball bridge 10 x  Supine feet  on ball and bring knee to chest to work the abdominals 20 x  Supine feet on ball rocking back and forth to work the obliques 20 x  Laying on side hip adduction 10 x each side Dead bug 3 x 5 Sitting on ball opposite arm and hip flexion 10 x  Sitting leaning back to work the transverse abdominal 10 x  Self-care: Educated patient on where she can get a physioball and what size, 65 cm    06/29/24 Neuromuscular re-education: Core facilitation: Supine marching with transverse abdominus contraction Supine alterate hip and shoulder flexion 20 x  One legged bridge with bilateral shoulder flexion 10 x 2 Laying on side hip adduction 10 x 2 each side Pelvic floor contraction training: Sitting on ball moving side to side, pelvic circles each way, and diagonals to feel the pelvic floor contract different ways Sit to stand from ball and mat with increased hip flexion and anterior tilt of the pelvis Self-care: Educated patient on not bearing down to fully empty her bladder and just leg it come out.     06/22/24 Self-care: Educated patient on not urinating until she has the urge to urinate. Educated her on how the she is training her bladder to urinate without the urge. Patient understands she is to wait for the urge to go to the bathroom and there is a little bit of urine that can remain in the bladder.  Neuromuscular re-education: Core retraining: Transverse abdominus contraction without lifting the pelvis 20 x and needed tactile cues Supine marching with concentration of contraction of the lower abdomen 5 x 2 each side Bridge 10 x  One legged bridge 5 x 2 each  Leaning on counter lift opposite arm  and leg 5 x 3 Standing hip abduction 3 x 5 with resistance    PATIENT EDUCATION:  07/06/24 Education details: Access Code: [email or web address] Person educated: Patient Education method: Explanation, Demonstration, Tactile cues, Verbal cues, and Handouts Education comprehension: verbalized understanding, returned demonstration, verbal cues required, tactile cues required, and needs further education  HOME EXERCISE PROGRAM: 07/06/24 Access Code: [email or web address] URL: [email or web address] Date: 07/06/2024 Prepared by: Channing Pereyra  Exercises - Seated Diaphragmatic Breathing  - 1 x daily - 7 x weekly - 1 sets - 10 reps - Hooklying Transversus Abdominis Palpation  - 3 x daily - 7 x weekly - 1 sets - 10 reps - Standing Hip Abduction with Resistance at Ankles and Counter Support  - 1 x daily - 3 x weekly - 3 sets - 5 reps - Seated Transversus Abdominis Bracing  - 1 x daily - 7 x weekly - 1 sets - 10 reps - Single Leg Bridge  -  1 x daily - 2 x weekly - 2 sets - 2 reps - Bird Dog on Counter  - 1 x daily - 2 x weekly - 3 sets - 5 reps - Supine Bridge with Pelvic Floor Contraction on Swiss Ball  - 1 x daily - 2 x weekly - 1 sets - 10 reps - Supine Knees to Chest with Swiss Ball  - 1 x daily - 2 x weekly - 1 sets - 10 reps - Supine Lower Trunk Rotation with Swiss Ball  - 1 x daily - 2 x weekly - 1 sets - 10 reps - Sidelying Hip Adduction  - 1 x daily - 2 x weekly - 2 sets - 10 reps - Dead Bug  - 1 x daily - 2 x weekly - 3 sets - 5 reps - Seated Lean Back on Swiss Ball  - 1 x daily - 2 x weekly - 1 sets - 10 reps - Seated March with Opposite Arm Flexion on Swiss Ball  - 1 x daily - 2 x weekly - 1 sets - 10 reps  ASSESSMENT:  CLINICAL IMPRESSION: Patient is a 73 y.o. female who was seen today for physical therapy  treatment for stress urinary leakage.  Patient reports her core is getting stronger. She is doing her HEP. She is not having as much urinary leakage between 5-7 PM. Patient leaked urine  when sitting with knees to chest and coughed.   Patient will benefit from skilled therapy to improve pelvic floor coordination strength to reduce the leakage.   OBJECTIVE IMPAIRMENTS: decreased coordination, decreased endurance, and decreased strength.   ACTIVITY LIMITATIONS: continence  PARTICIPATION LIMITATIONS: community activity  PERSONAL FACTORS: 1-2 comorbidities: Endometriosis; Skin Cancer are also affecting patient's functional outcome.   REHAB POTENTIAL: Excellent  CLINICAL DECISION MAKING: Stable/uncomplicated  EVALUATION COMPLEXITY: Low   GOALS: Goals reviewed with patient? Yes  SHORT TERM GOALS: Target date: 06/11/24  Patient able to perform diaphragmatic breathing to relax her pelvic floor.  Baseline: Goal status: Met 06/10/24  2.  Patient independent with hip strength to stabilize the pelvis.  Baseline:  Goal status: Met 06/22/24   LONG TERM GOALS: Target date: 08/06/24  Patient is independent with advanced HEP for core, pelvic floor and hip strength to reduce continence.  Baseline:  Goal status: ongoing 07/06/24  2.  Patient is able to go through the day from 5-7 PM without urinary leakage due to increased endurance of the pelvic floor muscles. Baseline:  Goal status: ongoing 07/06/24  3.  Patient is able to coughing and sneezing without leaking due to the ability to perform the [facility] to prepare the pelvic floor for the activity.  Baseline:  Goal status: ongoing 07/06/24  4.  Patient is able to contract the pelvic floor and lower abdominals correctly to reduce pressure on the pelvic floor and reduce urinary leakage. Baseline:  Goal status: Met 1229/25  PLAN:  PT FREQUENCY: 1-2x/week  PT DURATION: 12 weeks  PLANNED INTERVENTIONS: 97110-Therapeutic exercises, 97530- Therapeutic activity, 97112- Neuromuscular re-education, 97535- Self Care, [id number]- Manual therapy, and Patient/Family education; biofeedback; joint mobilization  PLAN FOR NEXT SESSION:     core and hip strength, if not leaking then discharge   Channing Pereyra, PT 07/06/2024 12:30 PM  "

## 2024-07-08 ENCOUNTER — Ambulatory Visit: Payer: Self-pay | Admitting: Physical Therapy

## 2024-07-15 ENCOUNTER — Encounter: Payer: Self-pay | Admitting: Physical Therapy

## 2024-07-15 ENCOUNTER — Ambulatory Visit: Attending: Family Medicine | Admitting: Physical Therapy

## 2024-07-15 DIAGNOSIS — M25551 Pain in right hip: Secondary | ICD-10-CM | POA: Diagnosis present

## 2024-07-15 DIAGNOSIS — R278 Other lack of coordination: Secondary | ICD-10-CM | POA: Diagnosis present

## 2024-07-15 DIAGNOSIS — M6281 Muscle weakness (generalized): Secondary | ICD-10-CM | POA: Insufficient documentation

## 2024-07-15 NOTE — Therapy (Signed)
 " OUTPATIENT PHYSICAL THERAPY FEMALE PELVIC TREATMENT   Patient Name: Allison Webb MRN: [id number] DOB:Nov 06, 1950, 74 y.o., female Today's Date: 07/15/2024  END OF SESSION:  PT End of Session - 07/15/24 0931     Visit Number 7    Date for Recertification  08/06/24    Authorization Type medicare    Authorization - Visit Number 7    Authorization - Number of Visits 10    PT Start Time 0930    PT Stop Time 1010    PT Time Calculation (min) 40 min    Activity Tolerance Patient tolerated treatment well    Behavior During Therapy [facility] for tasks assessed/performed          Past Medical History:  Diagnosis Date   Asthma    Pt states she does not have asathma   Eczema    Endometriosis    Hyperlipidemia    Skin cancer    Past Surgical History:  Procedure Laterality Date   ANKLE SURGERY     right ankle   APPENDECTOMY     KNEE SURGERY     right knee   LAPAROTOMY     Patient Active Problem List   Diagnosis Date Noted   History of food allergy  04/24/2017   Allergic reaction 04/24/2017   Cough, persistent 05/17/2014   Chest pain 01/18/2011   Hyperlipidemia 01/18/2011   Chronic rhinitis 09/09/2007   ACUTE BRONCHOSPASM 09/09/2007   Endometriosis 09/09/2007   SKIN CANCER, HX OF 09/09/2007    PCP: Okey Carlin Redbird, MD  REFERRING PROVIDER: Okey Carlin Redbird, MD   REFERRING DIAG: N39.3 (ICD-10-CM) - Stress incontinence (female) (female)   THERAPY DIAG:  Muscle weakness (generalized)  Other lack of coordination  Pain in right hip  Rationale for Evaluation and Treatment: Rehabilitation  ONSET DATE: 04/2024  SUBJECTIVE:                                                                                                                                                                                           SUBJECTIVE STATEMENT: I have had pain in my hips and gluteals. I feel like they are not relaxing. I have leaked 1 times that was a tiny bit. I have not done my  exercises due to the pain. Pain in my hips and back is 7/10.  Fluid intake: water, coffee, soda,   FUNCTIONAL LIMITATIONS: leak urine  PERTINENT HISTORY:  Medications for current condition: none Surgeries: Appendectomy; Laparotomy Other: Endometriosis; Skin Cancer Sexual abuse: No  PAIN:  Are you having pain? No  PRECAUTIONS: Other: skin cancer  RED FLAGS: None   WEIGHT BEARING RESTRICTIONS:  No  FALLS:  Has patient fallen in last 6 months? Yes. Number of falls hurrying on steps and grabbed doorframe, not due to balance  OCCUPATION: House wife  ACTIVITY LEVEL : tennis 2 times per week  PLOF: Independent  PATIENT GOALS: not leak urine, check right hip   URINATION: Pain with urination: No Fully empty bladder: No sometimes has to double void                                         Post-void dribble: No Stream: Strong Urgency: No Frequency:during the day every 2-3 hours                                                        Nocturia: No   Leakage: Coughing and Sneezing; when leaks it will spit a small amount of urine Pads/briefs: No  INTERCOURSE:  Ability to have vaginal penetration Yes  Pain with intercourse: none  PREGNANCY: Vaginal deliveries 1 Tearing Yes:    PROLAPSE: Bulge   OBJECTIVE:  Note: Objective measures were completed at Evaluation unless otherwise noted.   PATIENT SURVEYS:  PFIQ-7: 11 UIQ-7 14   COGNITION: Overall cognitive status: Within functional limits for tasks assessed     SENSATION: Light touch: Appears intact    FUNCTIONAL TESTS:  Single leg stance:  Rt: increased trunk sway for 3 sec Squat: decreased lumbar extension  GAIT: Assistive device utilized: None   POSTURE: rounded shoulders, forward head, and decreased lumbar lordosis   LUMBARAROM/PROM: decreased by 25%    LOWER EXTREMITY ROM: bilateral hip ROM is full   LOWER EXTREMITY MMT:  MMT Right eval Left eval  Hip flexion 4/5 5/5  Hip extension 4/5  4/5  Hip abduction 2+/5 4/5   (Blank rows = not tested) PALPATION:   Pelvic Alignment: ASIS are equal  Abdominal: contract the abdominals with slight bulge of the lower abdominals.   Diastasis: No Distortion: No  Breathing: upper rib cage breath Scar tissue: No                External Perineal Exam: tenderness located on the medial ischiotuberosity on the right                             Internal Pelvic Floor: tenderness located on the alcock's canal, when contracting the pelvic floor there is a slight bulge., tightness in the posterior vaginal canal  Patient confirms identification and approves PT to assess internal pelvic floor and treatment Yes All internal or external pelvic floor assessments and/or treatments are completed with proper hand hygiene and gloves hands. If needed gloves are changed with hand hygiene during patient care time. No emotional/communication barriers or cognitive limitation. Patient is motivated to learn. Patient understands and agrees with treatment goals and plan. PT explains patient will be examined in standing, sitting, and lying down to see how their muscles and joints work. When they are ready, they will be asked to remove their underwear so PT can examine their perineum. The patient is also given the option of providing their own chaperone as one is not provided in our facility. The patient also has the right and is explained  the right to defer or refuse any part of the evaluation or treatment including the internal exam. With the patient's consent, PT will use one gloved finger to gently assess the muscles of the pelvic floor, seeing how well it contracts and relaxes and if there is muscle symmetry. After, the patient will get dressed and PT and patient will discuss exam findings and plan of care. PT and patient discuss plan of care, schedule, attendance policy and HEP activities.   PELVIC MMT:   MMT eval 06/10/24  Vaginal 4/5 5/5  (Blank rows = not  tested)        TONE: Increased on the right  PROLAPSE: none  TODAY'S TREATMENT:     07/15/24 Neuromuscular re-education: Core facilitation: Supine transverse abdominus contraction without contracting the gluteal Form correction: Sitting on foam roll with pelvic tilt to increased lumbar extension Standing shift the hips laterally  to work on her curvature of her spine Pelvic floor contraction training: Sitting on a foam roll to feel the pelvic floor contract Exercises: Stretches/mobility: Pigeon pose moving into different ranges each  Standing lumbar extension using her hands for a fulcrum to increase extension 1/2 knee lumbar extension with increased hip flexor stretch Therapeutic activities: Functional strengthening activities: Standing with increased  lumbar lordosis to reduce the gluteal clenching, having her in the different positions in standing to see when her gluteal have relaxed.     07/06/24 Neuromuscular re-education: Pelvic floor contraction training: Sitting on ball moving side to side, pelvic circles each way, and diagonals to feel the pelvic floor contract different ways Supine with feet on ball bridge 10 x  Supine feet  on ball and bring knee to chest to work the abdominals 20 x  Supine feet on ball rocking back and forth to work the obliques 20 x  Laying on side hip adduction 10 x each side Dead bug 3 x 5 Sitting on ball opposite arm and hip flexion 10 x  Sitting leaning back to work the transverse abdominal 10 x  Self-care: Educated patient on where she can get a physioball and what size, 65 cm    06/29/24 Neuromuscular re-education: Core facilitation: Supine marching with transverse abdominus contraction Supine alterate hip and shoulder flexion 20 x  One legged bridge with bilateral shoulder flexion 10 x 2 Laying on side hip adduction 10 x 2 each side Pelvic floor contraction training: Sitting on ball moving side to side, pelvic circles each way, and  diagonals to feel the pelvic floor contract different ways Sit to stand from ball and mat with increased hip flexion and anterior tilt of the pelvis Self-care: Educated patient on not bearing down to fully empty her bladder and just leg it come out.       PATIENT EDUCATION:  07/16/23 Education details: Access Code: [email or web address] Person educated: Patient Education method: Explanation, Demonstration, Tactile cues, Verbal cues, and Handouts Education comprehension: verbalized understanding, returned demonstration, verbal cues required, tactile cues required, and needs further education  HOME EXERCISE PROGRAM: 07/16/23 Access Code: [email or web address] URL: [email or web address] Date: 07/15/2024 Prepared by: Channing Pereyra  Exercises - Seated Diaphragmatic Breathing  - 1 x daily - 7 x weekly - 1 sets - 10 reps - Hooklying Transversus Abdominis Palpation  - 3 x daily - 7 x weekly - 1 sets - 10 reps - Standing Hip Abduction with Resistance at Ankles and Counter Support  - 1 x daily - 3 x weekly - 3 sets - 5 reps - Seated Transversus Abdominis Bracing  -  1 x daily - 7 x weekly - 1 sets - 10 reps - Single Leg Bridge  - 1 x daily - 2 x weekly - 2 sets - 2 reps - Bird Dog on Counter  - 1 x daily - 2 x weekly - 3 sets - 5 reps - Supine Bridge with Pelvic Floor Contraction on Swiss Ball  - 1 x daily - 2 x weekly - 1 sets - 10 reps - Supine Knees to Chest with Swiss Ball  - 1 x daily - 2 x weekly - 1 sets - 10 reps - Supine Lower Trunk Rotation with Swiss Ball  - 1 x daily - 2 x weekly - 1 sets - 10 reps - Sidelying Hip Adduction  - 1 x daily - 2 x weekly - 2 sets - 10 reps - Dead Bug  - 1 x daily - 2 x weekly - 3 sets - 5 reps - Seated Lean Back on Swiss Ball  - 1 x daily - 2 x weekly - 1 sets - 10 reps - Seated March with Opposite Arm Flexion on Swiss Ball  - 1 x daily - 2 x weekly - 1 sets - 10 reps - Pigeon Pose  - 1 x daily - 2 x weekly - 1 sets - 2 reps - Half-Kneeling Thoracic Rotation at Wall  -  1 x daily - 2 x weekly - 1 sets - 10 reps - Standing Lumbar Extension  - 1 x daily - 2 x weekly - 1 sets - 10 reps  ASSESSMENT:  CLINICAL IMPRESSION: Patient is a 74 y.o. female who was seen today for physical therapy  treatment for stress urinary leakage.  Patient has only leaked a small drop one time since last visit. She stands with a clenched buttocks increasing her gluteal pain. She has learned how to work on her lumbar lordosis to reduce clenched gluteals. She has learned stretches to elongate the gluteal muscles.    Patient will benefit from skilled therapy to improve pelvic floor coordination strength to reduce the leakage.   OBJECTIVE IMPAIRMENTS: decreased coordination, decreased endurance, and decreased strength.   ACTIVITY LIMITATIONS: continence  PARTICIPATION LIMITATIONS: community activity  PERSONAL FACTORS: 1-2 comorbidities: Endometriosis; Skin Cancer are also affecting patient's functional outcome.   REHAB POTENTIAL: Excellent  CLINICAL DECISION MAKING: Stable/uncomplicated  EVALUATION COMPLEXITY: Low   GOALS: Goals reviewed with patient? Yes  SHORT TERM GOALS: Target date: 06/11/24  Patient able to perform diaphragmatic breathing to relax her pelvic floor.  Baseline: Goal status: Met 06/10/24  2.  Patient independent with hip strength to stabilize the pelvis.  Baseline:  Goal status: Met 06/22/24   LONG TERM GOALS: Target date: 08/06/24  Patient is independent with advanced HEP for core, pelvic floor and hip strength to reduce continence.  Baseline:  Goal status: ongoing 07/06/24  2.  Patient is able to go through the day from 5-7 PM without urinary leakage due to increased endurance of the pelvic floor muscles. Baseline:  Goal status: ongoing 07/06/24  3.  Patient is able to coughing and sneezing without leaking due to the ability to perform the [facility] to prepare the pelvic floor for the activity.  Baseline:  Goal status: ongoing 07/06/24  4.   Patient is able to contract the pelvic floor and lower abdominals correctly to reduce pressure on the pelvic floor and reduce urinary leakage. Baseline:  Goal status: Met 1229/25  PLAN:  PT FREQUENCY: 1-2x/week  PT DURATION: 12 weeks  PLANNED INTERVENTIONS: 97110-Therapeutic exercises, 97530- Therapeutic activity, [id number]- Neuromuscular re-education, [phone]- Self Care, 02859- Manual therapy, and Patient/Family education; biofeedback; joint mobilization  PLAN FOR NEXT SESSION:    core and hip strength, if not leaking then discharge   Channing Pereyra, PT 07/15/2024 11:17 AM  "

## 2024-07-20 ENCOUNTER — Ambulatory Visit: Admitting: Physical Therapy

## 2024-07-22 ENCOUNTER — Encounter: Payer: Self-pay | Admitting: Physical Therapy

## 2024-07-22 ENCOUNTER — Ambulatory Visit: Admitting: Physical Therapy

## 2024-07-22 DIAGNOSIS — R278 Other lack of coordination: Secondary | ICD-10-CM

## 2024-07-22 DIAGNOSIS — M6281 Muscle weakness (generalized): Secondary | ICD-10-CM | POA: Diagnosis not present

## 2024-07-22 NOTE — Therapy (Signed)
 " OUTPATIENT PHYSICAL THERAPY FEMALE PELVIC TREATMENT   Patient Name: Allison Webb MRN: [id number] DOB:Nov 21, 1950, 74 y.o., female Today's Date: 07/22/2024  END OF SESSION:  PT End of Session - 07/22/24 1147     Visit Number 8    Date for Recertification  08/06/24    Authorization Type medicare    Authorization - Visit Number 8    Authorization - Number of Visits 10    PT Start Time 1145    PT Stop Time 1225    PT Time Calculation (min) 40 min    Activity Tolerance Patient tolerated treatment well    Behavior During Therapy WFL for tasks assessed/performed          Past Medical History:  Diagnosis Date   Asthma    Pt states she does not have asathma   Eczema    Endometriosis    Hyperlipidemia    Skin cancer    Past Surgical History:  Procedure Laterality Date   ANKLE SURGERY     right ankle   APPENDECTOMY     KNEE SURGERY     right knee   LAPAROTOMY     Patient Active Problem List   Diagnosis Date Noted   History of food allergy  04/24/2017   Allergic reaction 04/24/2017   Cough, persistent 05/17/2014   Chest pain 01/18/2011   Hyperlipidemia 01/18/2011   Chronic rhinitis 09/09/2007   ACUTE BRONCHOSPASM 09/09/2007   Endometriosis 09/09/2007   SKIN CANCER, HX OF 09/09/2007    PCP: Okey Carlin Redbird, MD  REFERRING PROVIDER: Okey Carlin Redbird, MD   REFERRING DIAG: N39.3 (ICD-10-CM) - Stress incontinence (female) (female)   THERAPY DIAG:  Muscle weakness (generalized)  Other lack of coordination  Rationale for Evaluation and Treatment: Rehabilitation  ONSET DATE: 04/2024  SUBJECTIVE:                                                                                                                                                                                           SUBJECTIVE STATEMENT: I still have discomfort in the gluteal area. I am looking a land. I am getting a massage. I had a little leakage with small amount. I have the exercises.    Fluid intake: water, coffee, soda,   FUNCTIONAL LIMITATIONS: leak urine  PERTINENT HISTORY:  Medications for current condition: none Surgeries: Appendectomy; Laparotomy Other: Endometriosis; Skin Cancer Sexual abuse: No  PAIN:  Are you having pain? No  PRECAUTIONS: Other: skin cancer  RED FLAGS: None   WEIGHT BEARING RESTRICTIONS: No  FALLS:  Has patient fallen in last 6 months? Yes. Number of falls hurrying on steps  and grabbed doorframe, not due to balance  OCCUPATION: House wife  ACTIVITY LEVEL : tennis 2 times per week  PLOF: Independent  PATIENT GOALS: not leak urine, check right hip   URINATION: Pain with urination: No Fully empty bladder: No sometimes has to double void                                         Post-void dribble: No Stream: Strong Urgency: No Frequency:during the day every 2-3 hours                                                        Nocturia: No   Leakage: Coughing and Sneezing; when leaks it will spit a small amount of urine Pads/briefs: No  INTERCOURSE:  Ability to have vaginal penetration Yes  Pain with intercourse: none  PREGNANCY: Vaginal deliveries 1 Tearing Yes:    PROLAPSE: Bulge   OBJECTIVE:  Note: Objective measures were completed at Evaluation unless otherwise noted.   PATIENT SURVEYS:  PFIQ-7: 11 UIQ-7 14   COGNITION: Overall cognitive status: Within functional limits for tasks assessed     SENSATION: Light touch: Appears intact    FUNCTIONAL TESTS:  Single leg stance:  Rt: increased trunk sway for 3 sec Squat: decreased lumbar extension  GAIT: Assistive device utilized: None   POSTURE: rounded shoulders, forward head, and decreased lumbar lordosis   LUMBARAROM/PROM: decreased by 25%    LOWER EXTREMITY ROM: bilateral hip ROM is full   LOWER EXTREMITY MMT:  MMT Right eval Left eval  Hip flexion 4/5 5/5  Hip extension 4/5 4/5  Hip abduction 2+/5 4/5   (Blank rows = not  tested) PALPATION:   Pelvic Alignment: ASIS are equal  Abdominal: contract the abdominals with slight bulge of the lower abdominals.   Diastasis: No Distortion: No  Breathing: upper rib cage breath Scar tissue: No                External Perineal Exam: tenderness located on the medial ischiotuberosity on the right                             Internal Pelvic Floor: tenderness located on the alcock's canal, when contracting the pelvic floor there is a slight bulge., tightness in the posterior vaginal canal  Patient confirms identification and approves PT to assess internal pelvic floor and treatment Yes All internal or external pelvic floor assessments and/or treatments are completed with proper hand hygiene and gloves hands. If needed gloves are changed with hand hygiene during patient care time. No emotional/communication barriers or cognitive limitation. Patient is motivated to learn. Patient understands and agrees with treatment goals and plan. PT explains patient will be examined in standing, sitting, and lying down to see how their muscles and joints work. When they are ready, they will be asked to remove their underwear so PT can examine their perineum. The patient is also given the option of providing their own chaperone as one is not provided in our facility. The patient also has the right and is explained the right to defer or refuse any part of the evaluation or treatment including the internal exam. With  the patient's consent, PT will use one gloved finger to gently assess the muscles of the pelvic floor, seeing how well it contracts and relaxes and if there is muscle symmetry. After, the patient will get dressed and PT and patient will discuss exam findings and plan of care. PT and patient discuss plan of care, schedule, attendance policy and HEP activities.   PELVIC MMT:   MMT eval 06/10/24   Vaginal 4/5 5/5   (Blank rows = not tested)        TONE: Increased on the  right  PROLAPSE: none  TODAY'S TREATMENT:     07/22/24 Manual: Internal pelvic floor techniques: No emotional/communication barriers or cognitive limitation. Patient is motivated to learn. Patient understands and agrees with treatment goals and plan. PT explains patient will be examined in standing, sitting, and lying down to see how their muscles and joints work. When they are ready, they will be asked to remove their underwear so PT can examine their perineum. The patient is also given the option of providing their own chaperone as one is not provided in our facility. The patient also has the right and is explained the right to defer or refuse any part of the evaluation or treatment including the internal exam. With the patient's consent, PT will use one gloved finger to gently assess the muscles of the pelvic floor, seeing how well it contracts and relaxes and if there is muscle symmetry. After, the patient will get dressed and PT and patient will discuss exam findings and plan of care. PT and patient discuss plan of care, schedule, attendance policy and HEP activities.  Therapist gloved finger on the vaginal canal working on the sides of the introitus to improve circular contraction Neuromuscular re-education: Pelvic floor contraction training: Therapist gloved finger in the vaginal canal working on pelvic floor contraction holding 10 sec with initial strength 3/5 and after the manual work was 5/5 Verbally reviewed HEP and she feels she is doing them correctly Educated patient on how to perform manual work to the sides of the introitus to elongate the tissue for contraction Exercises: Stretches/mobility Pigeon pose stretch bilaterally holding 30 sec Standing lumbar extension Laying on side to stretch the lumbar    07/15/24 Neuromuscular re-education: Core facilitation: Supine transverse abdominus contraction without contracting the gluteal Form correction: Sitting on foam roll with  pelvic tilt to increased lumbar extension Standing shift the hips laterally  to work on her curvature of her spine Pelvic floor contraction training: Sitting on a foam roll to feel the pelvic floor contract Exercises: Stretches/mobility: Pigeon pose moving into different ranges each  Standing lumbar extension using her hands for a fulcrum to increase extension 1/2 knee lumbar extension with increased hip flexor stretch Therapeutic activities: Functional strengthening activities: Standing with increased  lumbar lordosis to reduce the gluteal clenching, having her in the different positions in standing to see when her gluteal have relaxed.     07/06/24 Neuromuscular re-education: Pelvic floor contraction training: Sitting on ball moving side to side, pelvic circles each way, and diagonals to feel the pelvic floor contract different ways Supine with feet on ball bridge 10 x  Supine feet  on ball and bring knee to chest to work the abdominals 20 x  Supine feet on ball rocking back and forth to work the obliques 20 x  Laying on side hip adduction 10 x each side Dead bug 3 x 5 Sitting on ball opposite arm and hip flexion 10 x  Sitting  leaning back to work the transverse abdominal 10 x  Self-care: Educated patient on where she can get a physioball and what size, 65 cm     PATIENT EDUCATION:  07/16/23 Education details: Access Code: [email or web address] Person educated: Patient Education method: Explanation, Demonstration, Tactile cues, Verbal cues, and Handouts Education comprehension: verbalized understanding, returned demonstration, verbal cues required, tactile cues required, and needs further education  HOME EXERCISE PROGRAM: 07/16/23 Access Code: [email or web address] URL: [email or web address] Date: 07/15/2024 Prepared by: Channing Pereyra  Exercises - Seated Diaphragmatic Breathing  - 1 x daily - 7 x weekly - 1 sets - 10 reps - Hooklying Transversus Abdominis Palpation  - 3 x daily - 7 x  weekly - 1 sets - 10 reps - Standing Hip Abduction with Resistance at Ankles and Counter Support  - 1 x daily - 3 x weekly - 3 sets - 5 reps - Seated Transversus Abdominis Bracing  - 1 x daily - 7 x weekly - 1 sets - 10 reps - Single Leg Bridge  - 1 x daily - 2 x weekly - 2 sets - 2 reps - Bird Dog on Counter  - 1 x daily - 2 x weekly - 3 sets - 5 reps - Supine Bridge with Pelvic Floor Contraction on Swiss Ball  - 1 x daily - 2 x weekly - 1 sets - 10 reps - Supine Knees to Chest with Swiss Ball  - 1 x daily - 2 x weekly - 1 sets - 10 reps - Supine Lower Trunk Rotation with Swiss Ball  - 1 x daily - 2 x weekly - 1 sets - 10 reps - Sidelying Hip Adduction  - 1 x daily - 2 x weekly - 2 sets - 10 reps - Dead Bug  - 1 x daily - 2 x weekly - 3 sets - 5 reps - Seated Lean Back on Swiss Ball  - 1 x daily - 2 x weekly - 1 sets - 10 reps - Seated March with Opposite Arm Flexion on Swiss Ball  - 1 x daily - 2 x weekly - 1 sets - 10 reps - Pigeon Pose  - 1 x daily - 2 x weekly - 1 sets - 2 reps - Half-Kneeling Thoracic Rotation at Wall  - 1 x daily - 2 x weekly - 1 sets - 10 reps - Standing Lumbar Extension  - 1 x daily - 2 x weekly - 1 sets - 10 reps  ASSESSMENT:  CLINICAL IMPRESSION: Patient is a 74 y.o. female who was seen today for physical therapy  treatment for stress urinary leakage.  Patient has only leaked a small drop one time since last visit. Patient is independent with her HEP. Patient is not leaking from 5-7 PM. She will have a drop of urinary leakage with coughing or sneezing. Patient pelvic floor strength is 5/5 after manual work. She was taught how to perform manual work to the introitus to keep the mobility of the tissue.   OBJECTIVE IMPAIRMENTS: decreased coordination, decreased endurance, and decreased strength.   ACTIVITY LIMITATIONS: continence  PARTICIPATION LIMITATIONS: community activity  PERSONAL FACTORS: 1-2 comorbidities: Endometriosis; Skin Cancer are also affecting  patient's functional outcome.   REHAB POTENTIAL: Excellent  CLINICAL DECISION MAKING: Stable/uncomplicated  EVALUATION COMPLEXITY: Low   GOALS: Goals reviewed with patient? Yes  SHORT TERM GOALS: Target date: 06/11/24  Patient able to perform diaphragmatic breathing to relax her pelvic floor.  Baseline: Goal status: Met 06/10/24  2.  Patient independent  with hip strength to stabilize the pelvis.  Baseline:  Goal status: Met 06/22/24   LONG TERM GOALS: Target date: 08/06/24  Patient is independent with advanced HEP for core, pelvic floor and hip strength to reduce continence.  Baseline:  Goal status: met 07/22/24  2.  Patient is able to go through the day from 5-7 PM without urinary leakage due to increased endurance of the pelvic floor muscles. Baseline:  Goal status: Met 07/22/24  3.  Patient is able to coughing and sneezing without leaking due to the ability to perform the [facility] to prepare the pelvic floor for the activity.  Baseline: small amount of leakage infrequently Goal status: not met 07/22/24   4.  Patient is able to contract the pelvic floor and lower abdominals correctly to reduce pressure on the pelvic floor and reduce urinary leakage. Baseline:  Goal status: Met 1229/25  PLAN: Discharge to HEP this visit     Jashiya Bassett, PT 07/22/2024 12:29 PM  PHYSICAL THERAPY DISCHARGE SUMMARY  Visits from Start of Care: 8  Current functional level related to goals / functional outcomes: See above.    Remaining deficits: See above.    Education / Equipment: HEP   Patient agrees to discharge. Patient goals were met. Patient is being discharged due to meeting the stated rehab goals. Thank you for the referral.   Komal Stangelo, PT 07/22/2024 12:29 PM   "
